# Patient Record
Sex: Male | Born: 1980 | Race: Asian | Hispanic: No | Marital: Married | Smoking: Never smoker
Health system: Southern US, Community
[De-identification: ages and names within clinical notes are randomized; demographics above are authoritative.]

## PROBLEM LIST (undated history)

## (undated) DIAGNOSIS — M109 Gout, unspecified: Secondary | ICD-10-CM

## (undated) DIAGNOSIS — Z9049 Acquired absence of other specified parts of digestive tract: Secondary | ICD-10-CM

## (undated) DIAGNOSIS — E669 Obesity, unspecified: Secondary | ICD-10-CM

## (undated) HISTORY — DX: Acquired absence of other specified parts of digestive tract: Z90.49

## (undated) HISTORY — PX: APPENDECTOMY: SHX54

## (undated) HISTORY — DX: Obesity, unspecified: E66.9

## (undated) HISTORY — DX: Gout, unspecified: M10.9

---

## 2012-01-02 ENCOUNTER — Ambulatory Visit (INDEPENDENT_AMBULATORY_CARE_PROVIDER_SITE_OTHER): Payer: BC Managed Care – PPO | Admitting: Internal Medicine

## 2012-01-02 ENCOUNTER — Encounter: Payer: Self-pay | Admitting: Internal Medicine

## 2012-01-02 VITALS — BP 116/90 | HR 74 | Temp 98.0°F | Resp 16 | Ht 67.5 in | Wt 233.8 lb

## 2012-01-02 DIAGNOSIS — Z0000 Encounter for general adult medical examination without abnormal findings: Secondary | ICD-10-CM

## 2012-01-02 DIAGNOSIS — L739 Follicular disorder, unspecified: Secondary | ICD-10-CM

## 2012-01-02 DIAGNOSIS — Z23 Encounter for immunization: Secondary | ICD-10-CM

## 2012-01-02 DIAGNOSIS — R361 Hematospermia: Secondary | ICD-10-CM

## 2012-01-02 DIAGNOSIS — R300 Dysuria: Secondary | ICD-10-CM

## 2012-01-02 DIAGNOSIS — L738 Other specified follicular disorders: Secondary | ICD-10-CM

## 2012-01-02 LAB — CBC WITH DIFFERENTIAL/PLATELET
Basophils Relative: 0 % (ref 0–1)
HCT: 45.6 % (ref 39.0–52.0)
Hemoglobin: 16 g/dL (ref 13.0–17.0)
Lymphocytes Relative: 30 % (ref 12–46)
Lymphs Abs: 2.3 10*3/uL (ref 0.7–4.0)
Monocytes Absolute: 0.7 10*3/uL (ref 0.1–1.0)
Monocytes Relative: 9 % (ref 3–12)
Neutro Abs: 4.6 10*3/uL (ref 1.7–7.7)
Neutrophils Relative %: 59 % (ref 43–77)
RBC: 5.32 MIL/uL (ref 4.22–5.81)

## 2012-01-02 LAB — HEPATIC FUNCTION PANEL
ALT: 35 U/L (ref 0–53)
Bilirubin, Direct: 0.1 mg/dL (ref 0.0–0.3)

## 2012-01-02 LAB — LIPID PANEL
Cholesterol: 176 mg/dL (ref 0–200)
LDL Cholesterol: 108 mg/dL — ABNORMAL HIGH (ref 0–99)
Total CHOL/HDL Ratio: 4 Ratio
VLDL: 24 mg/dL (ref 0–40)

## 2012-01-02 LAB — BASIC METABOLIC PANEL
CO2: 27 mEq/L (ref 19–32)
Chloride: 103 mEq/L (ref 96–112)
Glucose, Bld: 91 mg/dL (ref 70–99)
Potassium: 4.6 mEq/L (ref 3.5–5.3)
Sodium: 141 mEq/L (ref 135–145)

## 2012-01-02 LAB — TSH: TSH: 2.202 u[IU]/mL (ref 0.350–4.500)

## 2012-01-02 NOTE — Progress Notes (Signed)
  Subjective:    Patient ID: Connor Jefferson, male    DOB: March 07, 1981, 31 y.o.   MRN: [id number]  HPI  patient presents to clinic to establish care and physical exam. Has had eight occurrences the last occurrence approximately 3 weeks ago. No associated urethral discharge dysuria, penile or urethral pain. No scrotal masses noted by patient. Is sexually active. History of raised skin bumps lower posterior scalp. Has improved since stopped wearing hats. Was evaluated by dermatology in the past and took prescription topical gel without significant improvement.  Past Medical History  Diagnosis Date  . Hx of appendectomy    No past surgical history on file.  reports that he has never smoked. He does not have any smokeless tobacco history on file. His alcohol and drug histories not on file. family history includes Diabetes in his paternal grandfather; Healthy in his father; and Leukemia in his mother. No Known Allergies   Review of Systems  Genitourinary: Negative for dysuria, urgency, frequency, hematuria, discharge, penile swelling, scrotal swelling, difficulty urinating, penile pain and testicular pain.  Skin: Positive for rash.  All other systems reviewed and are negative.       Objective:   Physical Exam  Nursing note and vitals reviewed. Constitutional: He appears well-developed and well-nourished. No distress.  HENT:  Head: Normocephalic and atraumatic.  Right Ear: External ear normal.  Left Ear: External ear normal.  Nose: Nose normal.  Mouth/Throat: Oropharynx is clear and moist.  Eyes: Conjunctivae normal and EOM are normal. Pupils are equal, round, and reactive to light. No scleral icterus.  Neck: Neck supple. No thyromegaly present.  Cardiovascular: Normal rate, regular rhythm and normal heart sounds.  Exam reveals no gallop and no friction rub.   No murmur heard. Pulmonary/Chest: Effort normal and breath sounds normal. No respiratory distress. He has no wheezes. He has no  rales.  Abdominal: Soft. Bowel sounds are normal. He exhibits no distension and no mass. There is no tenderness. There is no rebound and no guarding.  Genitourinary: Penis normal. No penile tenderness.       No scrotal masses. No urethral discharge  Lymphadenopathy:    He has no cervical adenopathy.  Neurological: He is alert.  Skin: Skin is warm and dry. He is not diaphoretic.       Rare scattered erythematous papule located posterior scalp line. Appears follicular  Psychiatric: He has a normal mood and affect.          Assessment & Plan:

## 2012-01-03 LAB — URINALYSIS, ROUTINE W REFLEX MICROSCOPIC
Hgb urine dipstick: NEGATIVE
Leukocytes, UA: NEGATIVE
Nitrite: NEGATIVE
Protein, ur: NEGATIVE mg/dL
pH: 5.5 (ref 5.0–8.0)

## 2012-01-03 LAB — URINALYSIS, MICROSCOPIC ONLY
Bacteria, UA: NONE SEEN
Squamous Epithelial / LPF: NONE SEEN

## 2012-01-07 DIAGNOSIS — R361 Hematospermia: Secondary | ICD-10-CM | POA: Insufficient documentation

## 2012-01-07 DIAGNOSIS — Z0000 Encounter for general adult medical examination without abnormal findings: Secondary | ICD-10-CM | POA: Insufficient documentation

## 2012-01-07 DIAGNOSIS — L739 Follicular disorder, unspecified: Secondary | ICD-10-CM | POA: Insufficient documentation

## 2012-01-07 NOTE — Assessment & Plan Note (Signed)
Improved. Avoid hats. Attempt antibacterial soap cleansing at least 2-3 times a week

## 2012-01-07 NOTE — Assessment & Plan Note (Signed)
Normal exam. Obtain CPE labs. 

## 2012-01-07 NOTE — Assessment & Plan Note (Signed)
Obtain labs including urine analysis, urine for GC and chlamydia. If evaluation negative and has recurrence attempted empiric Septra. If further occurrences proceed with urology consultation /.states understanding and agreement

## 2012-07-04 ENCOUNTER — Emergency Department (HOSPITAL_BASED_OUTPATIENT_CLINIC_OR_DEPARTMENT_OTHER): Payer: BC Managed Care – PPO

## 2012-07-04 ENCOUNTER — Emergency Department (HOSPITAL_BASED_OUTPATIENT_CLINIC_OR_DEPARTMENT_OTHER)
Admission: EM | Admit: 2012-07-04 | Discharge: 2012-07-04 | Disposition: A | Discharge status: Home / Self Care | Payer: BC Managed Care – PPO | Attending: Emergency Medicine | Admitting: Emergency Medicine

## 2012-07-04 ENCOUNTER — Encounter (HOSPITAL_BASED_OUTPATIENT_CLINIC_OR_DEPARTMENT_OTHER): Payer: Self-pay | Admitting: *Deleted

## 2012-07-04 DIAGNOSIS — R0789 Other chest pain: Secondary | ICD-10-CM

## 2012-07-04 DIAGNOSIS — E669 Obesity, unspecified: Secondary | ICD-10-CM | POA: Insufficient documentation

## 2012-07-04 DIAGNOSIS — Z79899 Other long term (current) drug therapy: Secondary | ICD-10-CM | POA: Insufficient documentation

## 2012-07-04 NOTE — ED Notes (Signed)
Chest pain since this am. Sharp pain in his upper left chest.

## 2012-07-04 NOTE — ED Provider Notes (Signed)
History     CSN: [id number]  Arrival date & time 07/04/12  1954   First MD Initiated Contact with Patient 07/04/12 2043      Chief Complaint  Patient presents with  . Chest Pain    (Consider location/radiation/quality/duration/timing/severity/associated sxs/prior treatment) Patient is a 32 y.o. male presenting with chest pain.  Chest Pain  Patient has had 2 episodes of chest pain today, the first one was this morning, shortly after awakening lasted 21/2 hours left sided upper chest nonradiating pleuritic in quality and mild no associated shortness of breath no nausea or sweatiness symptoms resolved spontaneously without treatment. He experienced a second episode tonight at 7:30 PM which lasted approximately 30 minutes identical in quality to the first episode began results found his 2 without treatment. He is presently asymptomatic. No other associated symptoms no nausea no sweatiness. Cardiac risk factors none Past Medical History  Diagnosis Date  . Hx of appendectomy     Past Surgical History  Procedure Laterality Date  . Appendectomy      Family History  Problem Relation Age of Onset  . Diabetes Paternal Grandfather   . Leukemia Mother     deceased  . Healthy Father     History  Substance Use Topics  . Smoking status: Never Smoker   . Smokeless tobacco: Not on file  . Alcohol Use: No      Review of Systems  Constitutional: Negative.   HENT: Negative.   Respiratory: Negative.   Cardiovascular: Positive for chest pain.  Gastrointestinal: Negative.   Musculoskeletal: Negative.   Skin: Negative.   Psychiatric/Behavioral: Negative.   All other systems reviewed and are negative.    Allergies  Review of patient's allergies indicates no known allergies.  Home Medications   Current Outpatient Rx  Name  Route  Sig  Dispense  Refill  . Multiple Vitamin (MULTIVITAMIN) tablet   Oral   Take 1 tablet by mouth daily.           BP 127/87  Pulse 80   Temp(Src) 97.6 F (36.4 C) (Oral)  Resp 18  Wt 225 lb (102.059 kg)  BMI 34.7 kg/m2  SpO2 97%  Physical Exam  Nursing note and vitals reviewed. Constitutional: He appears well-developed and well-nourished.  HENT:  Head: Normocephalic and atraumatic.  Eyes: Conjunctivae are normal. Pupils are equal, round, and reactive to light.  Neck: Neck supple. No tracheal deviation present. No thyromegaly present.  Cardiovascular: Normal rate and regular rhythm.   No murmur heard. Pulmonary/Chest: Effort normal and breath sounds normal.  Abdominal: Soft. Bowel sounds are normal. He exhibits no distension. There is no tenderness.  Mildly obese  Musculoskeletal: Normal range of motion. He exhibits no edema and no tenderness.  Neurological: He is alert. Coordination normal.  Skin: Skin is warm and dry. No rash noted.  Psychiatric: He has a normal mood and affect.    ED Course  Procedures (including critical care time)  Labs Reviewed - No data to display No results found.   No diagnosis found.  Date: 07/04/2012  Rate: 80  Rhythm: normal sinus rhythm  QRS Axis: normal  Intervals: normal  ST/T Wave abnormalities: normal  Conduction Disutrbances: none  Narrative Interpretation: unremarkable No tracing to compare    Chest x-ray viewed by me   9:40 PM patient remains asymptomatic MDM   Plan keep scheduled appointment with PMD in 6 days. Assessment chest pain highly atypical for acute coronary syndrome in this young healthy male with atypical symptoms.  Strike her pulmonary embolism i.e. patient asymptomatic presently. Pain was mild and nonspecific Redness atypical chest pain        Doug Sou, MD 07/04/12 2148

## 2012-07-04 NOTE — ED Notes (Signed)
MD at bedside. 

## 2012-07-10 ENCOUNTER — Ambulatory Visit (INDEPENDENT_AMBULATORY_CARE_PROVIDER_SITE_OTHER): Payer: BC Managed Care – PPO | Admitting: Family

## 2012-07-10 ENCOUNTER — Encounter: Payer: Self-pay | Admitting: Family

## 2012-07-10 VITALS — BP 104/80 | HR 79 | Temp 98.6°F | Resp 16 | Wt 231.0 lb

## 2012-07-10 DIAGNOSIS — R0789 Other chest pain: Secondary | ICD-10-CM

## 2012-07-10 NOTE — Assessment & Plan Note (Signed)
Resolved. Was likely viral in origin.  Monitor.

## 2012-07-10 NOTE — Progress Notes (Signed)
  Subjective:    Patient ID: Connor Jefferson, male    DOB: Nov 14, 1980, 32 y.o.   MRN: [id number]  HPI  Connor Jefferson is a 32 yr old male who presents today for ED follow up. Records are reviewed.  He was seen on 3/27 with chief complaint of chest pain.  His chest pain was described as pleuritic in nature.  EKG was normal at that time.  CXR was negative.  He was discharged to home and recommended to keep scheduled appointment with [place] for atypical chest pain. He denies further chest pain. Denies sob, denies family hx of CAD.  He tells me today that he had cough and "phlegm" and felt like he was coming down with something the day that he went to the ED.   Review of Systems See HPI  Past Medical History  Diagnosis Date  . Hx of appendectomy     History   Social History  . Marital Status: Married    Spouse Name: N/A    Number of Children: N/A  . Years of Education: N/A   Occupational History  . Not on file.   Social History Main Topics  . Smoking status: Never Smoker   . Smokeless tobacco: Not on file  . Alcohol Use: No  . Drug Use: No  . Sexually Active: Not on file   Other Topics Concern  . Not on file   Social History Narrative  . No narrative on file    Past Surgical History  Procedure Laterality Date  . Appendectomy      Family History  Problem Relation Age of Onset  . Diabetes Paternal Grandfather   . Leukemia Mother     deceased  . Healthy Father     No Known Allergies  Current Outpatient Prescriptions on File Prior to Visit  Medication Sig Dispense Refill  . Multiple Vitamin (MULTIVITAMIN) tablet Take 1 tablet by mouth daily.       No current facility-administered medications on file prior to visit.    BP 104/80  Pulse 79  Temp(Src) 98.6 F (37 C) (Oral)  Resp 16  Wt 231 lb 0.6 oz (104.799 kg)  BMI 35.63 kg/m2  SpO2 98%       Objective:   Physical Exam  Constitutional: He is oriented to person, place, and time. He appears well-developed and  well-nourished. No distress.  HENT:  Head: Normocephalic and atraumatic.  Right Ear: Tympanic membrane and ear canal normal.  Left Ear: Tympanic membrane and ear canal normal.  Mouth/Throat: No oropharyngeal exudate, posterior oropharyngeal edema or posterior oropharyngeal erythema.  Cardiovascular: Normal rate and regular rhythm.   No murmur heard. Pulmonary/Chest: Effort normal and breath sounds normal. No respiratory distress. He has no wheezes. He has no rales. He exhibits no tenderness.  No chest wall tenderness to palpation.  Neurological: He is alert and oriented to person, place, and time.  Psychiatric: He has a normal mood and affect. His behavior is normal. Thought content normal.          Assessment & Plan:

## 2012-07-10 NOTE — Patient Instructions (Addendum)
Please schedule a physical in October 2014.

## 2013-01-06 ENCOUNTER — Encounter: Payer: BC Managed Care – PPO | Admitting: Internal Medicine

## 2013-01-13 ENCOUNTER — Encounter: Payer: BC Managed Care – PPO | Admitting: Family

## 2013-01-15 ENCOUNTER — Ambulatory Visit (INDEPENDENT_AMBULATORY_CARE_PROVIDER_SITE_OTHER): Payer: BC Managed Care – PPO | Admitting: Family Medicine

## 2013-01-15 ENCOUNTER — Encounter: Payer: Self-pay | Admitting: Family Medicine

## 2013-01-15 VITALS — BP 142/84 | HR 86 | Ht 68.0 in | Wt 236.0 lb

## 2013-01-15 DIAGNOSIS — Z0000 Encounter for general adult medical examination without abnormal findings: Secondary | ICD-10-CM

## 2013-01-15 DIAGNOSIS — Z131 Encounter for screening for diabetes mellitus: Secondary | ICD-10-CM

## 2013-01-15 DIAGNOSIS — Z13220 Encounter for screening for lipoid disorders: Secondary | ICD-10-CM

## 2013-01-15 DIAGNOSIS — Z23 Encounter for immunization: Secondary | ICD-10-CM

## 2013-01-15 NOTE — Patient Instructions (Signed)
Dr. Mikah Rottinghaus's General Advice Following Your Complete Physical Exam  The Benefits of Regular Exercise: Unless you suffer from an uncontrolled cardiovascular condition, studies strongly suggest that regular exercise and physical activity will add to both the quality and length of your life.  The World Health Organization recommends 150 minutes of moderate intensity aerobic activity every week.  This is best split over 3-4 days a week, and can be as simple as a brisk walk for just over 35 minutes "most days of the week".  This type of exercise has been shown to lower LDL-Cholesterol, lower average blood sugars, lower blood pressure, lower cardiovascular disease risk, improve memory, and increase one's overall sense of wellbeing.  The addition of anaerobic (or "strength training") exercises offers additional benefits including but not limited to increased metabolism, prevention of osteoporosis, and improved overall cholesterol levels.  How Can I Strive For A Low-Fat Diet?: Current guidelines recommend that 25-35 percent of your daily energy (food) intake should come from fats.  One might ask how can this be achieved without having to dissect each meal on a daily basis?  Switch to skim or 1% milk instead of whole milk.  Focus on lean meats such as ground turkey, fresh fish, baked chicken, and lean cuts of beef as your source of dietary protein.  Consume less than 300mg/day of dietary cholesterol.  Limit trans fatty acid consumption primarily by limiting synthetic trans fats such as partially hydrogenated oils (Ex: fried fast foods).  Focus efforts on reducing your intake of "solid" fats (Ex: Butter).  Substitute olive or vegetable oil for solid fats where possible.  Moderation of Salt Intake: Provided you don't carry a diagnosis of congestive heart failure nor renal failure, I recommend a daily allowance of no more than 2300 mg of salt (sodium).  Keeping under this daily goal is associated with a  decreased risk of cardiovascular events, creeping above it can lead to elevated blood pressures and increases your risk of cardiovascular events.  Milligrams (mg) of salt is listed on all nutrition labels, and your daily intake can add up faster than you think.  Most canned and frozen dinners can pack in over half your daily salt allowance in one meal.    Lifestyle Health Risks: Certain lifestyle choices carry specific health risks.  As you may already know, tobacco use has been associated with increasing one's risk of cardiovascular disease, pulmonary disease, numerous cancers, among many other issues.  What you may not know is that there are medications and nicotine replacement strategies that can more than double your chances of successfully quitting.  I would be thrilled to help manage your quitting strategy if you currently use tobacco products.  When it comes to alcohol use, I've yet to find an "ideal" daily allowance.  Provided an individual does not have a medical condition that is exacerbated by alcohol consumption, general guidelines determine "safe drinking" as no more than two standard drinks for a man or no more than one standard drink for a male per day.  However, much debate still exists on whether any amount of alcohol consumption is technically "safe".  My general advice, keep alcohol consumption to a minimum for general health promotion.  If you or others believe that alcohol, tobacco, or recreational drug use is interfering with your life, I would be happy to provide confidential counseling regarding treatment options.  General "Over The Counter" Nutrition Advice: Postmenopausal women should aim for a daily calcium intake of 1200 mg, however a significant   portion of this might already be provided by diets including milk, yogurt, cheese, and other dairy products.  Vitamin D has been shown to help preserve bone density, prevent fatigue, and has even been shown to help reduce falls in the  elderly.  Ensuring a daily intake of 800 Units of Vitamin D is a good place to start to enjoy the above benefits, we can easily check your Vitamin D level to see if you'd potentially benefit from supplementation beyond 800 Units a day.  Folic Acid intake should be of particular concern to women of childbearing age.  Daily consumption of 400-800 mcg of Folic Acid is recommended to minimize the chance of spinal cord defects in a fetus should pregnancy occur.    For many adults, accidents still remain one of the most common culprits when it comes to cause of death.  Some of the simplest but most effective preventitive habits you can adopt include regular seatbelt use, proper helmet use, securing firearms, and regularly testing your smoke and carbon monoxide detectors.  Ova Meegan B. Alberta Cairns DO [facility] [address], [address] [place], [place] [address] Phone: [phone]  

## 2013-01-15 NOTE — Progress Notes (Signed)
CC: Connor Jefferson is a 32 y.o. male is here for Establish Care   Subjective: HPI:  Colonoscopy: No family history: Cancer will begin screening at age 59 Prostate: Discussed screening risks/beneifts with patient on 01/15/2013 no family history of prostate cancer we'll consider screening age 83  Influenza Vaccine: He will receive today Pneumovax: No current indication Td/Tdap: Tday 2014 Zoster: (Start 31 yo)  Presents with no acute complaints requesting physical exam  Over the past 2 weeks have you been bothered by: - Little interest or pleasure in doing things: No - Feeling down depressed or hopeless: No  Rare alcohol use, no tobacco use, no recreational drug use  Tries to watch what he eats it gets for room for improvement, no formal exercise.  Review of Systems - General ROS: negative for - chills, fever, night sweats, weight gain or weight loss Ophthalmic ROS: negative for - decreased vision Psychological ROS: negative for - anxiety or depression ENT ROS: negative for - hearing change, nasal congestion, tinnitus or allergies Hematological and Lymphatic ROS: negative for - bleeding problems, bruising or swollen lymph nodes Breast ROS: negative Respiratory ROS: no cough, shortness of breath, or wheezing Cardiovascular ROS: no chest pain or dyspnea on exertion Gastrointestinal ROS: no abdominal pain, change in bowel habits, or black or bloody stools Genito-Urinary ROS: negative for - genital discharge, genital ulcers, incontinence or abnormal bleeding from genitals Musculoskeletal ROS: negative for - joint pain or muscle pain Neurological ROS: negative for - headaches or memory loss Dermatological ROS: negative for lumps, mole changes, rash and skin lesion changes  Past Medical History  Diagnosis Date  . Hx of appendectomy      Family History  Problem Relation Age of Onset  . Diabetes Paternal Grandfather   . Leukemia Mother     deceased  . Healthy Father       History  Substance Use Topics  . Smoking status: Never Smoker   . Smokeless tobacco: Not on file  . Alcohol Use: No     Objective: Filed Vitals:   01/15/13 1508  BP: 142/84  Pulse: 86    General: No Acute Distress HEENT: Atraumatic, normocephalic, conjunctivae normal without scleral icterus.  No nasal discharge, hearing grossly intact, TMs with good landmarks bilaterally with no middle ear abnormalities, posterior pharynx clear without oral lesions. Neck: Supple, trachea midline, no cervical nor supraclavicular adenopathy. Pulmonary: Clear to auscultation bilaterally without wheezing, rhonchi, nor rales. Cardiac: Regular rate and rhythm.  No murmurs, rubs, nor gallops. No peripheral edema.  2+ peripheral pulses bilaterally. Abdomen: Bowel sounds normal.  No masses.  Non-tender without rebound.  Negative Murphy's sign. GU: Bilateral descended testes no inguinal hernia MSK: Grossly intact, no signs of weakness.  Full strength throughout upper and lower extremities.  Full ROM in upper and lower extremities.  No midline spinal tenderness. Neuro: Gait unremarkable, CN II-XII grossly intact.  C5-C6 Reflex 2/4 Bilaterally, L4 Reflex 2/4 Bilaterally.  Cerebellar function intact. Skin: Scattered healing not infected mosquito bites on the legs Psych: Alert and oriented to person/place/time.  Thought process normal. No anxiety/depression.  Assessment & Plan: Connor Jefferson was seen today for establish care.  Diagnoses and associated orders for this visit:  Elevated blood pressure - BASIC METABOLIC PANEL WITH GFR  Screening, lipid - Lipid panel  Diabetes mellitus screening - BASIC METABOLIC PANEL WITH GFR  Physical exam, annual    Healthy lifestyle interventions including but limited to regular exercise, a healthy low fat diet, moderation of salt intake,  the dangers of tobacco/alcohol/recreational drug use, nutrition supplementation, and accident avoidance were discussed with the patient  and a handout was provided for future reference. Discussed low salt diet to lower blood pressure if blood pressure remains above 140/90 return in 4 weeks for further evaluation Routine lipid screening and diabetic screening   Return if symptoms worsen or fail to improve.

## 2013-01-30 ENCOUNTER — Ambulatory Visit (INDEPENDENT_AMBULATORY_CARE_PROVIDER_SITE_OTHER): Payer: BC Managed Care – PPO

## 2013-01-30 ENCOUNTER — Encounter: Payer: Self-pay | Admitting: Sports Medicine

## 2013-01-30 ENCOUNTER — Ambulatory Visit (INDEPENDENT_AMBULATORY_CARE_PROVIDER_SITE_OTHER): Payer: BC Managed Care – PPO | Admitting: Sports Medicine

## 2013-01-30 VITALS — BP 124/76 | HR 65 | Wt 234.0 lb

## 2013-01-30 DIAGNOSIS — M79609 Pain in unspecified limb: Secondary | ICD-10-CM

## 2013-01-30 DIAGNOSIS — M25579 Pain in unspecified ankle and joints of unspecified foot: Secondary | ICD-10-CM

## 2013-01-30 DIAGNOSIS — M79675 Pain in left toe(s): Secondary | ICD-10-CM | POA: Insufficient documentation

## 2013-01-30 MED ORDER — TRAMADOL HCL 50 MG PO TABS: ORAL_TABLET | ORAL | Status: DC

## 2013-01-30 NOTE — Progress Notes (Signed)
  Subjective:    CC: Left foot pain  HPI: This is a pleasant 32 year old male who presents with one week of foot pain. He hit his left foot against a metal pole while moving furniture about a week ago. The pain was not severe initially but it has worsened and become excruciating at times. The pain is localized at the medial surface of the left MTP. Weight-bearing is difficult. His job requires him to be on his feet, and he has only been able to tolerate working one day this week. He has had some bruising as well as some swelling that worsens with activity. He has been treating the pain and swelling with rest, ice, elevation and ibuprofen. The ibuprofen does not help much with the pain. He would like something stronger for the pain but needs a doctor's note for drug testing purposes.  Past medical history, Surgical history, Family history not pertinant except as noted below, Social history, Allergies, and medications have been entered into the medical record, reviewed, and no changes needed.   Review of Systems: No fevers, chills, night sweats, weight loss, chest pain, or shortness of breath.   Objective:    General: Well Developed, well nourished, and in no acute distress.  Neuro: Alert and oriented x3, extra-ocular muscles intact, sensation grossly intact.  HEENT: Normocephalic, atraumatic, pupils equal round reactive to light, neck supple, no masses, no lymphadenopathy, thyroid nonpalpable.  Skin: Warm and dry, no rashes. Cardiac: Regular rate and rhythm, no murmurs rubs or gallops, no lower extremity edema.  Respiratory: Clear to auscultation bilaterally. Not using accessory muscles, speaking in full sentences. Right Foot: Swelling and erythema at base of first MTP. Tender to palpation. Great toe flexion 4/5; Great toe extension 4/5.  Procedure: Real-time Ultrasound Guided aspiration/Injection of left first metatarsophalangeal joint Device: GE Logiq E  Verbal informed consent obtained.    Time-out conducted.  Noted no overlying erythema, induration, or other signs of local infection.  Skin prepped in a sterile fashion.  Local anesthesia: Topical Ethyl chloride.  With sterile technique and under real time ultrasound guidance:  A small amount of cloudy fluid was aspirated, this was diluted with sterile saline, and then deposited in the Vacutainer. Through a separate injection, 0.5 cc Kenalog 40, 0.5 cc lidocaine injected easily. Fluid will be sent off for crystal analysis. Completed without difficulty  Pain immediately resolved suggesting accurate placement of the medication.  Advised to call if fevers/chills, erythema, induration, drainage, or persistent bleeding.  Images permanently stored and available for review in the ultrasound unit.  Impression: Technically successful ultrasound guided injection.  Impression and Recommendations:    Plan: This is a 32 year old male with pain and swelling following trauma to the right 1st MTP that is suspicious for gout, although a fracture should be ruled out.  1. X-ray today of left foot 2. Aspiration of fluid to r/o gout 3. Glucocorticoid injection for pain relief 4. Tramadol for pain 5. Return for follow-up in 2 weeks  This note was originally written by Karin Lieu MS3.

## 2013-01-30 NOTE — Assessment & Plan Note (Signed)
X-rays were negative for fracture. Aspiration and injection. Will be sent off for crystal analysis. Tramadol for pain. Return to see me in 2 weeks to see how things are going.

## 2013-01-31 LAB — BASIC METABOLIC PANEL WITH GFR
BUN: 14 mg/dL (ref 6–23)
CO2: 31 mEq/L (ref 19–32)
Chloride: 102 mEq/L (ref 96–112)
GFR, Est Non African American: >89 mL/min
Potassium: 4.6 mEq/L (ref 3.5–5.3)

## 2013-01-31 LAB — SYNOVIAL CELL COUNT + DIFF, W/ CRYSTALS
Crystals, Fluid: NONE SEEN
Eosinophils-Synovial: 0 % (ref 0–1)
Lymphocytes-Synovial Fld: 7 % (ref 0–20)
Monocyte/Macrophage: 9 % — ABNORMAL LOW (ref 50–90)
Neutrophil, Synovial: 84 % — ABNORMAL HIGH (ref 0–25)
WBC, Synovial: 165 cu mm (ref 0–200)

## 2013-01-31 LAB — LIPID PANEL
Cholesterol: 184 mg/dL (ref 0–200)
VLDL: 38 mg/dL (ref 0–40)

## 2013-02-17 ENCOUNTER — Ambulatory Visit (INDEPENDENT_AMBULATORY_CARE_PROVIDER_SITE_OTHER): Payer: BC Managed Care – PPO | Admitting: Sports Medicine

## 2013-02-17 ENCOUNTER — Encounter: Payer: Self-pay | Admitting: Sports Medicine

## 2013-02-17 VITALS — BP 136/92 | HR 74 | Wt 229.0 lb

## 2013-02-17 DIAGNOSIS — M79609 Pain in unspecified limb: Secondary | ICD-10-CM

## 2013-02-17 DIAGNOSIS — M79675 Pain in left toe(s): Secondary | ICD-10-CM

## 2013-02-17 NOTE — Progress Notes (Signed)
  Subjective:    CC: Follow up  HPI: Left great toe pain: Completely resolved after injection. Very happy with results.  Past medical history, Surgical history, Family history not pertinant except as noted below, Social history, Allergies, and medications have been entered into the medical record, reviewed, and no changes needed.   Review of Systems: No fevers, chills, night sweats, weight loss, chest pain, or shortness of breath.   Objective:    General: Well Developed, well nourished, and in no acute distress.  Neuro: Alert and oriented x3, extra-ocular muscles intact, sensation grossly intact.  HEENT: Normocephalic, atraumatic, pupils equal round reactive to light, neck supple, no masses, no lymphadenopathy, thyroid nonpalpable.  Skin: Warm and dry, no rashes. Cardiac: Regular rate and rhythm, no murmurs rubs or gallops, no lower extremity edema.  Respiratory: Clear to auscultation bilaterally. Not using accessory muscles, speaking in full sentences. Left Foot: No visible erythema or swelling. Range of motion is full in all directions. Strength is 5/5 in all directions. No hallux valgus. No pes cavus or pes planus. No abnormal callus noted. No pain over the navicular prominence, or base of fifth metatarsal. No tenderness to palpation of the calcaneal insertion of plantar fascia. No pain at the Achilles insertion. No pain over the calcaneal bursa. No pain of the retrocalcaneal bursa. No tenderness to palpation over the tarsals, metatarsals, or phalanges. No hallux rigidus or limitus. No tenderness palpation over interphalangeal joints. No pain with compression of the metatarsal heads. Neurovascularly intact distally.  Impression and Recommendations:

## 2013-02-17 NOTE — Assessment & Plan Note (Addendum)
Completely resolved with guided injection.  He will come back for custom orthotics as he desires, we do need to check for leg length discrepancy. He will also be dropping off a biometric screening form for his PCP.

## 2013-03-17 ENCOUNTER — Encounter: Payer: Self-pay | Admitting: Sports Medicine

## 2013-03-17 ENCOUNTER — Ambulatory Visit (INDEPENDENT_AMBULATORY_CARE_PROVIDER_SITE_OTHER): Payer: BC Managed Care – PPO | Admitting: Sports Medicine

## 2013-03-17 VITALS — BP 143/84 | HR 85 | Wt 229.0 lb

## 2013-03-17 DIAGNOSIS — M79609 Pain in unspecified limb: Secondary | ICD-10-CM

## 2013-03-17 DIAGNOSIS — M79675 Pain in left toe(s): Secondary | ICD-10-CM

## 2013-03-17 NOTE — Assessment & Plan Note (Signed)
Orthotics as above. Return as needed. 

## 2013-03-17 NOTE — Progress Notes (Signed)
    Patient was fitted for a : standard, cushioned, semi-rigid orthotic. The orthotic was heated and afterward the patient stood on the orthotic blank positioned on the orthotic stand. The patient was positioned in subtalar neutral position and 10 degrees of ankle dorsiflexion in a weight bearing stance. After completion of molding, a stable base was applied to the orthotic blank. The blank was ground to a stable position for weight bearing. Size: 12 Base: Blue EVA Additional Posting and Padding: None The patient ambulated these, and they were very comfortable.  I spent 40 minutes with this patient, greater than 50% was face-to-face time counseling regarding the below diagnosis.   

## 2013-03-24 ENCOUNTER — Ambulatory Visit (INDEPENDENT_AMBULATORY_CARE_PROVIDER_SITE_OTHER): Payer: BC Managed Care – PPO | Admitting: Sports Medicine

## 2013-03-24 ENCOUNTER — Encounter: Payer: Self-pay | Admitting: Sports Medicine

## 2013-03-24 VITALS — BP 135/90 | HR 87 | Wt 232.0 lb

## 2013-03-24 DIAGNOSIS — M79672 Pain in left foot: Secondary | ICD-10-CM

## 2013-03-24 DIAGNOSIS — M25579 Pain in unspecified ankle and joints of unspecified foot: Secondary | ICD-10-CM

## 2013-03-24 DIAGNOSIS — M109 Gout, unspecified: Secondary | ICD-10-CM | POA: Insufficient documentation

## 2013-03-24 NOTE — Progress Notes (Signed)
  Subjective:    CC: Foot pain  HPI: This is a very pleasant 32 year old male, several months ago we aspirated and injected his metatarsophalngeal joint, the aspiration came back negative for any crystals. He did extremely well and we made him some custom orthotics. Unfortunately for the past few weeks she's had pain that he localizes in the dorsal and plantar mid foot, worse with weightbearing. He denies any injury, and has no constitutional symptoms. Pain is severe, persistent. No other joint pain or swelling.  Past medical history, Surgical history, Family history not pertinant except as noted below, Social history, Allergies, and medications have been entered into the medical record, reviewed, and no changes needed.   Review of Systems: No fevers, chills, night sweats, weight loss, chest pain, or shortness of breath.   Objective:    General: Well Developed, well nourished, and in no acute distress.  Neuro: Alert and oriented x3, extra-ocular muscles intact, sensation grossly intact.  HEENT: Normocephalic, atraumatic, pupils equal round reactive to light, neck supple, no masses, no lymphadenopathy, thyroid nonpalpable.  Skin: Warm and dry, no rashes. Cardiac: Regular rate and rhythm, no murmurs rubs or gallops, no lower extremity edema.  Respiratory: Clear to auscultation bilaterally. Not using accessory muscles, speaking in full sentences. Left Foot: No visible erythema or swelling. Range of motion is full in all directions. Strength is 5/5 in all directions. No hallux valgus. No pes cavus or pes planus. No abnormal callus noted. No pain over the navicular prominence, or base of fifth metatarsal. No tenderness to palpation of the calcaneal insertion of plantar fascia. No pain at the Achilles insertion. No pain over the calcaneal bursa. No pain of the retrocalcaneal bursa. Tender to palpation over the dorsal mid foot, also tender to palpation from the plantar aspect. No hallux  rigidus or limitus. No tenderness palpation over interphalangeal joints. No pain with compression of the metatarsal heads. Neurovascularly intact distally.  Procedure: Real-time Ultrasound Guided Injection of dorsal intertarsal joint Device: GE Logiq E  Verbal informed consent obtained.  Time-out conducted.  Noted no overlying erythema, induration, or other signs of local infection.  Skin prepped in a sterile fashion.  Local anesthesia: Topical Ethyl chloride.  With sterile technique and under real time ultrasound guidance:  Noted copious synovitis as well as effusion in the midfoot intertarsal joints. 25-gauge needle advanced into the joint, 0.5 cc Kenalog 40, 0.5 cc lidocaine injected easily. Completed without difficulty  Pain immediately resolved suggesting accurate placement of the medication.  Advised to call if fevers/chills, erythema, induration, drainage, or persistent bleeding.  Images permanently stored and available for review in the ultrasound unit.  Impression: Technically successful ultrasound guided injection.  Foot was then strapped with compressive dressing.  Impression and Recommendations:

## 2013-03-24 NOTE — Assessment & Plan Note (Signed)
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 Injection as above. If this recurs we could certainly need to consider crystalline arthropathy, and I would likely get advanced imaging. Return in one month.

## 2013-04-14 ENCOUNTER — Telehealth: Payer: Self-pay | Admitting: *Deleted

## 2013-04-14 ENCOUNTER — Ambulatory Visit (INDEPENDENT_AMBULATORY_CARE_PROVIDER_SITE_OTHER): Payer: BC Managed Care – PPO | Admitting: Sports Medicine

## 2013-04-14 ENCOUNTER — Encounter: Payer: Self-pay | Admitting: Sports Medicine

## 2013-04-14 VITALS — BP 124/81 | HR 87 | Wt 235.0 lb

## 2013-04-14 DIAGNOSIS — M79609 Pain in unspecified limb: Secondary | ICD-10-CM

## 2013-04-14 DIAGNOSIS — M79672 Pain in left foot: Secondary | ICD-10-CM

## 2013-04-14 MED ORDER — PREDNISONE (PAK) 10 MG PO TABS: ORAL_TABLET | ORAL | Status: DC

## 2013-04-14 NOTE — Telephone Encounter (Signed)
No PA required for MRI Foot[facility]An[facility] [place]  Meyer CoryMisty Ahmad, LPN

## 2013-04-14 NOTE — Progress Notes (Addendum)
  Subjective:    CC: Followup  HPI: Connor Jefferson comes back with persistent pain in his left foot, he has had synovitis in the metatarsophalangeal joint which was injected and improved, then the subtalar joint which was injected and improved, copious synovitis was seen on ultrasound. More recently he has pain he localizes posterior and distal to the medial malleolus. He does have some swelling in his ankle. We did attempt an aspiration when we did his metatarsophalangeal joint, this was negative for crystals however the sample was very small. Pain is moderate, persistent. Worse with weightbearing.  Past medical history, Surgical history, Family history not pertinant except as noted below, Social history, Allergies, and medications have been entered into the medical record, reviewed, and no changes needed.   Review of Systems: No fevers, chills, night sweats, weight loss, chest pain, or shortness of breath.   Objective:    General: Well Developed, well nourished, and in no acute distress.  Neuro: Alert and oriented x3, extra-ocular muscles intact, sensation grossly intact.  HEENT: Normocephalic, atraumatic, pupils equal round reactive to light, neck supple, no masses, no lymphadenopathy, thyroid nonpalpable.  Skin: Warm and dry, no rashes. Cardiac: Regular rate and rhythm, no murmurs rubs or gallops, no lower extremity edema.  Respiratory: Clear to auscultation bilaterally. Not using accessory muscles, speaking in full sentences. Left Ankle: Swollen around the talocrural joint, no tenderness over the subtalar joint or the first metatarsophalangeal joint. Range of motion is full in all directions. Strength is 5/5 in all directions. Stable lateral and medial ligaments; squeeze test and kleiger test unremarkable; Talar dome nontender; No pain at base of 5th MT; No tenderness over cuboid; No tenderness over N spot or navicular prominence No tenderness on posterior aspects of lateral and medial  malleolus No sign of peroneal tendon subluxations or tenderness to palpation Negative tarsal tunnel tinel's Able to walk 4 steps.  I spent a total of 40 minutes with this patient for greater than 50% was face-to-face time discussing the above diagnosis.  Impression and Recommendations:

## 2013-04-14 NOTE — Assessment & Plan Note (Addendum)
Pain has now traveled to the talocrural joint and tibialis posterior. He is status post injections of the first metatarsophalangeal joint as well as interpersonal joints with copious synovitis seen on ultrasound. Now he is pain-free at the prior injection sites. Unfortunately with persistent symptoms I am going to mobilize him in a boot, add a prednisone taper, and we are going to obtain an MRI of his foot and ankle. I like to see him back to go results of the MRI. At some point I would also certainly like to obtain serum uric acid levels.

## 2013-04-14 NOTE — Addendum Note (Signed)
Addended by: Monica BectonHEKKEKANDAM, Vicky Schleich J on: 04/14/2013 01:01 PM   Modules accepted: Level of Service

## 2013-04-15 ENCOUNTER — Ambulatory Visit (HOSPITAL_BASED_OUTPATIENT_CLINIC_OR_DEPARTMENT_OTHER)
Admission: RE | Admit: 2013-04-15 | Discharge: 2013-04-15 | Disposition: A | Discharge status: Home / Self Care | Payer: BC Managed Care – PPO | Source: Ambulatory Visit | Attending: Sports Medicine | Admitting: Sports Medicine

## 2013-04-15 DIAGNOSIS — M659 Synovitis and tenosynovitis, unspecified: Secondary | ICD-10-CM | POA: Insufficient documentation

## 2013-04-15 DIAGNOSIS — R269 Unspecified abnormalities of gait and mobility: Secondary | ICD-10-CM | POA: Insufficient documentation

## 2013-04-15 DIAGNOSIS — M65979 Unspecified synovitis and tenosynovitis, unspecified ankle and foot: Secondary | ICD-10-CM | POA: Insufficient documentation

## 2013-04-15 DIAGNOSIS — M9420 Chondromalacia, unspecified site: Secondary | ICD-10-CM | POA: Insufficient documentation

## 2013-04-15 DIAGNOSIS — M25476 Effusion, unspecified foot: Principal | ICD-10-CM | POA: Insufficient documentation

## 2013-04-15 DIAGNOSIS — M25473 Effusion, unspecified ankle: Secondary | ICD-10-CM | POA: Insufficient documentation

## 2013-04-15 DIAGNOSIS — M79609 Pain in unspecified limb: Secondary | ICD-10-CM | POA: Insufficient documentation

## 2013-04-21 ENCOUNTER — Telehealth: Payer: Self-pay | Admitting: Sports Medicine

## 2013-04-21 NOTE — Telephone Encounter (Signed)
Spoke to patient advised patient to schedule a fu appt to go over results. Rhonda Cunningham,CMA

## 2013-04-21 NOTE — Telephone Encounter (Signed)
Pt called about results from MRI done a week ago

## 2013-04-22 ENCOUNTER — Encounter: Payer: Self-pay | Admitting: Sports Medicine

## 2013-04-22 ENCOUNTER — Ambulatory Visit (INDEPENDENT_AMBULATORY_CARE_PROVIDER_SITE_OTHER): Payer: BC Managed Care – PPO | Admitting: Sports Medicine

## 2013-04-22 VITALS — BP 146/82 | HR 100 | Wt 230.0 lb

## 2013-04-22 DIAGNOSIS — M79672 Pain in left foot: Secondary | ICD-10-CM

## 2013-04-22 DIAGNOSIS — M79609 Pain in unspecified limb: Secondary | ICD-10-CM

## 2013-04-22 LAB — CBC WITH DIFFERENTIAL/PLATELET
Basophils Absolute: 0 K/uL (ref 0.0–0.1)
Basophils Relative: 0 % (ref 0–1)
Eosinophils Absolute: 0 10*3/uL (ref 0.0–0.7)
Eosinophils Relative: 0 % (ref 0–5)
HCT: 49.5 % (ref 39.0–52.0)
Hemoglobin: 17.1 g/dL — ABNORMAL HIGH (ref 13.0–17.0)
Lymphocytes Relative: 10 % — ABNORMAL LOW (ref 12–46)
Lymphs Abs: 1.4 10*3/uL (ref 0.7–4.0)
MCH: 30.1 pg (ref 26.0–34.0)
MCHC: 34.5 g/dL (ref 30.0–36.0)
MCV: 87 fL (ref 78.0–100.0)
Monocytes Absolute: 0.6 K/uL (ref 0.1–1.0)
Monocytes Relative: 4 % (ref 3–12)
Neutro Abs: 12.5 K/uL — ABNORMAL HIGH (ref 1.7–7.7)
Neutrophils Relative %: 86 % — ABNORMAL HIGH (ref 43–77)
Platelets: 320 10*3/uL (ref 150–400)
RBC: 5.69 MIL/uL (ref 4.22–5.81)
RDW: 13.4 % (ref 11.5–15.5)
WBC: 14.5 K/uL — ABNORMAL HIGH (ref 4.0–10.5)

## 2013-04-22 LAB — CK: Total CK: 50 U/L (ref 7–232)

## 2013-04-22 LAB — RHEUMATOID FACTOR: Rheumatoid fact SerPl-aCnc: 11 [IU]/mL (ref <=14)

## 2013-04-22 LAB — SEDIMENTATION RATE: Sed Rate: 4 mm/h (ref 0–16)

## 2013-04-22 LAB — URIC ACID: Uric Acid, Serum: 6.8 mg/dL (ref 4.0–7.8)

## 2013-04-22 MED ORDER — MELOXICAM 15 MG PO TABS: ORAL_TABLET | ORAL | Status: DC

## 2013-04-22 NOTE — Assessment & Plan Note (Signed)
Pain has resolved with prednisone taper. MRI does show fairly significant effusion in the talocrural joint, as well as the talonavicular joint. 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 He can come back to see me on an as needed basis. I am also going to add daily Mobic.

## 2013-04-22 NOTE — Progress Notes (Signed)
  Subjective:    CC: Follow up  HPI: Ankle pain: Connor Jefferson has had metatarsophalangeal as well as intertarsal joint injections in the past, more recently he presented with ankle joint pain and swelling. We placed him on prednisone, and ordered an MRI. His pain and swelling has essentially completely resolved, and he denies any constitutional symptoms. MRI results will be dictated below.  Past medical history, Surgical history, Family history not pertinant except as noted below, Social history, Allergies, and medications have been entered into the medical record, reviewed, and no changes needed.   Review of Systems: No fevers, chills, night sweats, weight loss, chest pain, or shortness of breath.   Objective:    General: Well Developed, well nourished, and in no acute distress.  Neuro: Alert and oriented x3, extra-ocular muscles intact, sensation grossly intact.  HEENT: Normocephalic, atraumatic, pupils equal round reactive to light, neck supple, no masses, no lymphadenopathy, thyroid nonpalpable.  Skin: Warm and dry, no rashes. Cardiac: Regular rate and rhythm, no murmurs rubs or gallops, no lower extremity edema.  Respiratory: Clear to auscultation bilaterally. Not using accessory muscles, speaking in full sentences. Left ankle: No visible erythema or swelling. Range of motion is full in all directions. Strength is 5/5 in all directions. Stable lateral and medial ligaments; squeeze test and kleiger test unremarkable; Talar dome nontender; No pain at base of 5th MT; No tenderness over cuboid; No tenderness over N spot or navicular prominence No tenderness on posterior aspects of lateral and medial malleolus No sign of peroneal tendon subluxations or tenderness to palpation Negative tarsal tunnel tinel's Able to walk 4 steps.  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  Impression and Recommendations:

## 2013-04-23 LAB — ANTI-NUCLEAR AB-TITER (ANA TITER): ANA Titer 1: NEGATIVE

## 2013-04-23 LAB — ANA: Anti Nuclear Antibody(ANA): POSITIVE — AB

## 2013-04-23 LAB — CYCLIC CITRUL PEPTIDE ANTIBODY, IGG: Cyclic Citrullin Peptide Ab: <2 U/mL (ref 0.0–5.0)

## 2013-12-12 ENCOUNTER — Telehealth: Payer: Self-pay | Admitting: Family Medicine

## 2013-12-12 DIAGNOSIS — R0683 Snoring: Secondary | ICD-10-CM

## 2013-12-12 DIAGNOSIS — G478 Other sleep disorders: Secondary | ICD-10-CM

## 2013-12-12 DIAGNOSIS — R0681 Apnea, not elsewhere classified: Secondary | ICD-10-CM

## 2013-12-12 MED ORDER — TRIAMCINOLONE ACETONIDE 0.1 % EX CREA: TOPICAL_CREAM | CUTANEOUS | Status: DC

## 2013-12-12 NOTE — Telephone Encounter (Signed)
Rash on arms while here for son's [facility]

## 2013-12-12 NOTE — Addendum Note (Signed)
Addended by: Laren Boom on: 12/12/2013 03:23 PM   Modules accepted: Orders

## 2014-03-04 ENCOUNTER — Encounter (HOSPITAL_BASED_OUTPATIENT_CLINIC_OR_DEPARTMENT_OTHER): Payer: BC Managed Care – PPO

## 2014-03-08 ENCOUNTER — Encounter (HOSPITAL_BASED_OUTPATIENT_CLINIC_OR_DEPARTMENT_OTHER): Payer: BC Managed Care – PPO

## 2014-03-20 ENCOUNTER — Ambulatory Visit (HOSPITAL_BASED_OUTPATIENT_CLINIC_OR_DEPARTMENT_OTHER): Payer: BC Managed Care – PPO | Attending: Family Medicine | Admitting: Radiology

## 2014-03-20 VITALS — Ht 68.0 in | Wt 230.0 lb

## 2014-03-20 DIAGNOSIS — R0681 Apnea, not elsewhere classified: Secondary | ICD-10-CM

## 2014-03-20 DIAGNOSIS — R0683 Snoring: Secondary | ICD-10-CM | POA: Diagnosis not present

## 2014-03-20 DIAGNOSIS — G4710 Hypersomnia, unspecified: Secondary | ICD-10-CM | POA: Diagnosis not present

## 2014-03-20 DIAGNOSIS — G478 Other sleep disorders: Secondary | ICD-10-CM

## 2014-03-20 DIAGNOSIS — G4730 Sleep apnea, unspecified: Secondary | ICD-10-CM | POA: Insufficient documentation

## 2014-03-30 NOTE — Addendum Note (Signed)
Addended by: Teodoro KilJENKINS, Glyndon Tursi D III on: 03/30/2014 10:43 AM   Modules accepted: Level of Service

## 2014-03-31 ENCOUNTER — Encounter: Payer: Self-pay | Admitting: Sports Medicine

## 2014-03-31 ENCOUNTER — Ambulatory Visit (INDEPENDENT_AMBULATORY_CARE_PROVIDER_SITE_OTHER): Payer: BC Managed Care – PPO | Admitting: Sports Medicine

## 2014-03-31 VITALS — BP 149/95 | HR 112 | Ht 69.0 in | Wt 244.0 lb

## 2014-03-31 DIAGNOSIS — M25572 Pain in left ankle and joints of left foot: Secondary | ICD-10-CM | POA: Diagnosis not present

## 2014-03-31 DIAGNOSIS — M79672 Pain in left foot: Secondary | ICD-10-CM

## 2014-03-31 MED ORDER — HYDROCODONE-ACETAMINOPHEN 5-325 MG PO TABS: 1.0000 | ORAL_TABLET | Freq: Three times a day (TID) | ORAL | Status: DC | PRN

## 2014-03-31 NOTE — Progress Notes (Signed)
  Subjective:    CC: Left ankle pain  HPI: This is a pleasant 33 year old male, haven't seen him in quite some time. In the distant past I injected his intertarsal joints, over one year ago. He had a fantastic response with a one-year relief of pain. More recently he developed increasing pain and swelling over the lateral ankle and first MTP. Pain is severe, persistent. 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  Past medical history, Surgical history, Family history not pertinant except as noted below, Social history, Allergies, and medications have been entered into the medical record, reviewed, and no changes needed.   Review of Systems: No fevers, chills, night sweats, weight loss, chest pain, or shortness of breath.   Objective:    General: Well Developed, well nourished, and in no acute distress.  Neuro: Alert and oriented x3, extra-ocular muscles intact, sensation grossly intact.  HEENT: Normocephalic, atraumatic, pupils equal round reactive to light, neck supple, no masses, no lymphadenopathy, thyroid nonpalpable.  Skin: Warm and dry, no rashes. Cardiac: Regular rate and rhythm, no murmurs rubs or gallops, no lower extremity edema.  Respiratory: Clear to auscultation bilaterally. Not using accessory muscles, speaking in full sentences. Left ankle: Swollen over the talocrural joint, there is exquisite tenderness to palpation around the peroneal tendons distal to the lateral malleolus, there is also significant swelling and tenderness to palpation over the first metatarsophalangeal joint. There is no erythema or induration.  Procedure: Real-time Ultrasound Guided Injection of left talocrural joint Device: GE Logiq E  Verbal informed consent obtained.  Time-out conducted.  Noted no overlying erythema, induration, or other signs of local infection.  Skin prepped in a sterile fashion.  Local anesthesia: Topical Ethyl chloride.  With sterile technique and under  real time ultrasound guidance:  Noted extensive effusion in the talocrural joint while I was imaging his peroneal tendons, I guided and 18-gauge needle into this effusion, and we aspirated 3 mL of cloudy straw-colored fluid, syringe switched and 1 mL kenalog 40, 2 mL lidocaine injected easily. Completed without difficulty  Pain immediately resolved suggesting accurate placement of the medication.  Advised to call if fevers/chills, erythema, induration, drainage, or persistent bleeding.  Images permanently stored and available for review in the ultrasound unit.  Impression: Technically successful ultrasound guided injection.  Procedure: Real-time Ultrasound Guided Injection of left first metatarsophalangeal joint Device: GE Logiq E  Verbal informed consent obtained.  Time-out conducted.  Noted no overlying erythema, induration, or other signs of local infection.  Skin prepped in a sterile fashion.  Local anesthesia: Topical Ethyl chloride.  With sterile technique and under real time ultrasound guidance:  0.5 mL kenalog 40, 0.5 mL lidocaine injected easily into the first metatarsophalangeal joint, there was significant effusion. Completed without difficulty  Pain immediately resolved suggesting accurate placement of the medication.  Advised to call if fevers/chills, erythema, induration, drainage, or persistent bleeding.  Images permanently stored and available for review in the ultrasound unit.  Impression: Technically successful ultrasound guided injection.  Impression and Recommendations:

## 2014-03-31 NOTE — Assessment & Plan Note (Addendum)
Aspiration of the intertarsal joints, I got a good 3 mL of cloudy fluid. We then injected the intertarsal joints as well as the left first metatarsophalangeal joint. Crystal analysis. Hydrocodone for pain. This does appear to be gout. Further treatment will however depend on results of the Crystal analysis. His first arthrocentesis was negative, however this one yielded much better fluid.

## 2014-04-02 LAB — SYNOVIAL CELL COUNT + DIFF, W/ CRYSTALS
Crystals, Fluid: NONE SEEN
Eosinophils-Synovial: 0 % (ref 0–1)
Lymphocytes-Synovial Fld: 20 % (ref 0–20)
Monocyte/Macrophage: 4 % — ABNORMAL LOW (ref 50–90)
Neutrophil, Synovial: 76 % — ABNORMAL HIGH (ref 0–25)
WBC, Synovial: 77990 uL — ABNORMAL HIGH (ref 0–200)

## 2014-04-05 DIAGNOSIS — G4730 Sleep apnea, unspecified: Secondary | ICD-10-CM

## 2014-04-05 DIAGNOSIS — G478 Other sleep disorders: Secondary | ICD-10-CM

## 2014-04-05 DIAGNOSIS — R0683 Snoring: Secondary | ICD-10-CM

## 2014-04-05 LAB — BODY FLUID CULTURE
Gram Stain: NONE SEEN
Organism ID, Bacteria: NO GROWTH

## 2014-04-05 NOTE — Sleep Study (Signed)
   [facility] DATE OF BIRTH:  1980/12/10 MEDICAL RECORD NUMBER [id number]  LOCATION: [facility]  PHYSICIAN: Tonnette Zwiebel D  DATE OF STUDY: 03/20/2014  SLEEP STUDY TYPE: Nocturnal Polysomnogram               REFERRING PHYSICIAN: Laren BoomHommel, Sean, DO  INDICATION FOR STUDY: Hypersomnia with sleep apnea  EPWORTH SLEEPINESS SCORE:   10/24 HEIGHT: 5\' 8"  (172.7 cm)  WEIGHT: 230 lb (104.327 kg)    Body mass index is 34.98 kg/(m^2).  NECK SIZE: 17.5 in.  MEDICATIONS: Charted for review   SLEEP ARCHITECTURE: total sleep time 309.5 minutes with sleep efficiency 80.1%. Stage I was 10.3%, stage II 75%, stage III 1.3%, REM 13.4% of total sleep time. Sleep latency 19 minutes, REM latency 137.5 minutes, awake after sleep onset 58 minutes, arousal index 6.8, bedtime medication: None   RESPIRATORY DATA: Apnea hypopnea index (AHI) 3.5 per hour. 18 total events scored including one obstructive apnea and 17 hypopneas. Events were more common while supine. REM AHI 8.7 per hour. There were not enough events to permit split protocol CPAP titration.  OXYGEN DATA: Moderately loud snoring with oxygen desaturation to a nadir of 87% and mean saturation 94.3% on room air.  CARDIAC DATA: Normal sinus rhythm  MOVEMENT/PARASOMNIA: A few incidental limb jerks were noted with little effect on sleep. Bathroom 1.  IMPRESSION/ RECOMMENDATION:   1) Within normal limits. Occasional respiratory events with sleep disturbance, AHI 3.5 per hour. The normal range for adults is an AHI from 0-5 events per hour. Moderately loud snoring with oxygen desaturation to a nadir of 87% and mean saturation 94.3% on room air. 2) Sleep architecture was not unusual for sleep center environment. Since a major complaint was daytime sleepiness, consider exploring whether the patient's sleep pattern at home is different.   Waymon BudgeYOUNG,Viet Kemmerer D Diplomate, American Board of Sleep Medicine  ELECTRONICALLY SIGNED ON:   04/05/2014, 9:19 AM [facility] PH: [phone]   FX: [phone] ACCREDITED BY THE AMERICAN ACADEMY OF SLEEP MEDICINE

## 2014-04-06 ENCOUNTER — Telehealth: Payer: Self-pay | Admitting: Family Medicine

## 2014-04-06 NOTE — Telephone Encounter (Signed)
Sue Lushndrea, Will you please let patient know that his sleep test was normal with no signs of sleep apnea.

## 2014-04-06 NOTE — Telephone Encounter (Signed)
Pt.notified

## 2014-04-14 ENCOUNTER — Ambulatory Visit (INDEPENDENT_AMBULATORY_CARE_PROVIDER_SITE_OTHER): Payer: BLUE CROSS/BLUE SHIELD | Admitting: Sports Medicine

## 2014-04-14 ENCOUNTER — Encounter: Payer: Self-pay | Admitting: Sports Medicine

## 2014-04-14 DIAGNOSIS — M2550 Pain in unspecified joint: Secondary | ICD-10-CM | POA: Diagnosis not present

## 2014-04-14 NOTE — Progress Notes (Signed)
  Subjective:    CC: Follow-up  HPI: Connor Jefferson returns, I have treated him on several occasions in the past for foot synovitis and effusion, he has responded well to aspirations and injections. Aspirations have never yielded any crystals however white blood cell counts have been as high as 70,000 with negative cultures. Initial rheumatoid workup was negative with the exception of a positive ANA with a negative titer. Uric acid levels were 6.8. He recently had another episode of synovitis, I was able to aspirate 3 mL of cloudy fluid from his subtalar joints, we then injected his symptoms resolved quickly. Fluid analysis was negative for crystals, cultures have been negative.  Past medical history, Surgical history, Family history not pertinant except as noted below, Social history, Allergies, and medications have been entered into the medical record, reviewed, and no changes needed.   Review of Systems: No fevers, chills, night sweats, weight loss, chest pain, or shortness of breath.   Objective:    General: Well Developed, well nourished, and in no acute distress.  Neuro: Alert and oriented x3, extra-ocular muscles intact, sensation grossly intact.  HEENT: Normocephalic, atraumatic, pupils equal round reactive to light, neck supple, no masses, no lymphadenopathy, thyroid nonpalpable.  Skin: Warm and dry, no rashes. Cardiac: Regular rate and rhythm, no murmurs rubs or gallops, no lower extremity edema.  Respiratory: Clear to auscultation bilaterally. Not using accessory muscles, speaking in full sentences. Feet: No visible erythema or swelling. Range of motion is full in all directions. Strength is 5/5 in all directions. No hallux valgus. No pes cavus or pes planus. No abnormal callus noted. No pain over the navicular prominence, or base of fifth metatarsal. No tenderness to palpation of the calcaneal insertion of plantar fascia. No pain at the Achilles insertion. No pain over the calcaneal  bursa. No pain of the retrocalcaneal bursa. No tenderness to palpation over the tarsals, metatarsals, or phalanges. No hallux rigidus or limitus. No tenderness palpation over interphalangeal joints. No pain with compression of the metatarsal heads. Neurovascularly intact distally.  Impression and Recommendations:

## 2014-04-14 NOTE — Assessment & Plan Note (Addendum)
Connor Jefferson had another episode of synovitis with joint effusion in the intertarsal joints. I aspirated 3 mL of cloudy fluid under ultrasound guidance with a 70,000 white blood cell count but no crystals and a negative Gram stain and culture. We injected the midfoot intertarsal joints under guidance, and he improved significantly. He did have significant synovitis as well. Previous rheumatoid workup was negative, he did have positive ANA with a negative titer. Uric acid levels were 6.8 at the time. I'm going to repeat a rheumatoid workup, and refer him to rheumatology for further assistance. We will forward the new blood work to rheumatology. I think she may fall in the gray area between normal and autoimmune disease.  Labs do show uric acid levels over 10, hyperuricemia combined with synovitis and effusion likely mean he has gout despite a negative crystal analysis. I am going to start relatively high-dose allopurinol, and recheck in one month.

## 2014-04-15 LAB — ANGIOTENSIN CONVERTING ENZYME: Angiotensin-Converting Enzyme: 31 U/L (ref 8–52)

## 2014-04-15 LAB — CBC WITH DIFFERENTIAL/PLATELET
Basophils Absolute: 0 K/uL (ref 0.0–0.1)
Basophils Relative: 0 % (ref 0–1)
Eosinophils Absolute: 0.2 10*3/uL (ref 0.0–0.7)
Eosinophils Relative: 2 % (ref 0–5)
HCT: 47.8 % (ref 39.0–52.0)
Hemoglobin: 16.7 g/dL (ref 13.0–17.0)
Lymphocytes Relative: 30 % (ref 12–46)
Lymphs Abs: 2.5 K/uL (ref 0.7–4.0)
MCH: 30.3 pg (ref 26.0–34.0)
MCHC: 34.9 g/dL (ref 30.0–36.0)
MCV: 86.6 fL (ref 78.0–100.0)
MPV: 10.2 fL (ref 8.6–12.4)
Monocytes Absolute: 0.6 K/uL (ref 0.1–1.0)
Monocytes Relative: 7 % (ref 3–12)
Neutro Abs: 5 10*3/uL (ref 1.7–7.7)
Neutrophils Relative %: 61 % (ref 43–77)
Platelets: 267 10*3/uL (ref 150–400)
RBC: 5.52 MIL/uL (ref 4.22–5.81)
RDW: 13.3 % (ref 11.5–15.5)
WBC: 8.2 K/uL (ref 4.0–10.5)

## 2014-04-15 LAB — COMPREHENSIVE METABOLIC PANEL WITH GFR
ALT: 36 U/L (ref 0–53)
Alkaline Phosphatase: 74 U/L (ref 39–117)
CO2: 30 meq/L (ref 19–32)
Chloride: 100 meq/L (ref 96–112)
Creat: 0.98 mg/dL (ref 0.50–1.35)
Potassium: 4.4 meq/L (ref 3.5–5.3)

## 2014-04-15 LAB — SYSTEMIC LUPUS PANEL-COMPREHENSIVE
ENA SM Ab Ser-aCnc: <1
Jo-1 Antibody, IgG: <1
SM/RNP: <1
SSA (Ro) (ENA) Antibody, IgG: <1
SSB (La) (ENA) Antibody, IgG: <1
Scleroderma (Scl-70) (ENA) Antibody, IgG: <1
ds DNA Ab: <1 IU/mL

## 2014-04-15 LAB — HEPATITIS PANEL, ACUTE
HCV Ab: NEGATIVE
Hep A IgM: NONREACTIVE
Hep B C IgM: NONREACTIVE
Hepatitis B Surface Ag: NEGATIVE

## 2014-04-15 LAB — COMPREHENSIVE METABOLIC PANEL
AST: 21 U/L (ref 0–37)
Albumin: 4.9 g/dL (ref 3.5–5.2)
BUN: 14 mg/dL (ref 6–23)
Calcium: 10 mg/dL (ref 8.4–10.5)
Glucose, Bld: 91 mg/dL (ref 70–99)
Sodium: 140 mEq/L (ref 135–145)
Total Bilirubin: 0.6 mg/dL (ref 0.2–1.2)
Total Protein: 8.2 g/dL (ref 6.0–8.3)

## 2014-04-15 LAB — RHEUMATOID FACTOR: Rheumatoid fact SerPl-aCnc: <10 [IU]/mL (ref <=14)

## 2014-04-15 LAB — CK: Total CK: 86 U/L (ref 7–232)

## 2014-04-15 LAB — HLA-B27 ANTIGEN: DNA Result:: NOT DETECTED

## 2014-04-15 LAB — IGG, IGA, IGM
IgA: 213 mg/dL (ref 68–379)
IgG (Immunoglobin G), Serum: 1560 mg/dL (ref 650–1600)
IgM, Serum: 112 mg/dL (ref 41–251)

## 2014-04-15 LAB — URIC ACID: Uric Acid, Serum: 10.2 mg/dL — ABNORMAL HIGH (ref 4.0–7.8)

## 2014-04-15 LAB — SEDIMENTATION RATE: Sed Rate: 1 mm/hr (ref 0–16)

## 2014-04-15 MED ORDER — ALLOPURINOL 300 MG PO TABS: 300.0000 mg | ORAL_TABLET | Freq: Every day | ORAL | Status: DC

## 2014-04-15 NOTE — Addendum Note (Signed)
Addended by: Monica BectonHEKKEKANDAM, Diamonique Ruedas J on: 04/15/2014 10:39 AM   Modules accepted: Orders

## 2014-04-16 LAB — PROTEIN ELECTROPHORESIS, SERUM, WITH REFLEX
Albumin ELP: 58.2 % (ref 55.8–66.1)
Alpha-1-Globulin: 3.3 % (ref 2.9–4.9)
Alpha-2-Globulin: 8.6 % (ref 7.1–11.8)
Beta 2: 5.6 % (ref 3.2–6.5)
Beta Globulin: 5.7 % (ref 4.7–7.2)
Gamma Globulin: 18.6 % (ref 11.1–18.8)
Total Protein, Serum Electrophoresis: 8.2 g/dL (ref 6.0–8.3)

## 2014-04-16 LAB — COMPLEMENT, TOTAL: Compl, Total (CH50): 54 U/mL (ref 31–60)

## 2014-04-16 LAB — CYCLIC CITRUL PEPTIDE ANTIBODY, IGG: Cyclic Citrullin Peptide Ab: <2 U/mL (ref 0.0–5.0)

## 2014-04-24 ENCOUNTER — Telehealth: Payer: Self-pay | Admitting: *Deleted

## 2014-04-24 NOTE — Telephone Encounter (Signed)
Connor Jefferson says that he has developed a rash since starting the allopurinol but only on his arm. He denies SOB or vision changes and no dizziness. He said that it also makes him very sleepy. Is this normal? Please advise.

## 2014-04-24 NOTE — Telephone Encounter (Signed)
Decrease to one half tab at bedtime for a week then go back up to 1 full tablet at bedtime

## 2014-04-24 NOTE — Telephone Encounter (Signed)
Detailed voicemail left for patient and for him to call office if he has any further questions.

## 2014-05-01 ENCOUNTER — Ambulatory Visit: Payer: BLUE CROSS/BLUE SHIELD | Admitting: Sports Medicine

## 2014-05-12 ENCOUNTER — Ambulatory Visit: Payer: BLUE CROSS/BLUE SHIELD | Admitting: Sports Medicine

## 2014-08-21 ENCOUNTER — Ambulatory Visit (INDEPENDENT_AMBULATORY_CARE_PROVIDER_SITE_OTHER): Payer: BLUE CROSS/BLUE SHIELD | Admitting: Family Medicine

## 2014-08-21 ENCOUNTER — Encounter: Payer: Self-pay | Admitting: Family Medicine

## 2014-08-21 VITALS — BP 146/93 | HR 103 | Wt 236.0 lb

## 2014-08-21 DIAGNOSIS — R361 Hematospermia: Secondary | ICD-10-CM | POA: Diagnosis not present

## 2014-08-21 DIAGNOSIS — M7021 Olecranon bursitis, right elbow: Secondary | ICD-10-CM

## 2014-08-21 MED ORDER — SULFAMETHOXAZOLE-TRIMETHOPRIM 800-160 MG PO TABS: 1.0000 | ORAL_TABLET | Freq: Two times a day (BID) | ORAL | Status: DC

## 2014-08-21 NOTE — Progress Notes (Signed)
CC: Connor Jefferson is a 34 y.o. male is here for blood in semen   Subjective: HPI:  Bright red blood in ejaculate that occurred on Sunday. When this occurred it had immediate testicular and penile shaft pain. Since then he's had no other genitourinary complaints. He's had sex since then and it did not cause any pain or produce any blood in his semen. He denies dysuria, urinary urgency, frequency, nor discharge. He had this happen once before but was 3 years ago. He denies any recent urologic surgery. No family history of prostate cancer.  Complaints of swelling of right elbow. It is been present for the last month it seems to be getting bigger on a weekly basis. Painless. Nothing seems to make it better or worse there's been no intervention as of yet and he denies any elbow pain or recent injury. No fevers, chills, nausea, flushing, nor joint pain elsewhere.   Review Of Systems Outlined In HPI  Past Medical History  Diagnosis Date  . Hx of appendectomy     Past Surgical History  Procedure Laterality Date  . Appendectomy     Family History  Problem Relation Age of Onset  . Diabetes Paternal Grandfather   . Leukemia Mother     deceased  . Healthy Father     History   Social History  . Marital Status: Married    Spouse Name: N/A  . Number of Children: N/A  . Years of Education: N/A   Occupational History  . Not on file.   Social History Main Topics  . Smoking status: Never Smoker   . Smokeless tobacco: Not on file  . Alcohol Use: No  . Drug Use: No  . Sexual Activity: Yes   Other Topics Concern  . Not on file   Social History Narrative     Objective: BP 146/93 mmHg  Pulse 103  Wt 236 lb (107.049 kg)  Vital signs reviewed. General: Alert and Oriented, No Acute Distress HEENT: Pupils equal, round, reactive to light. Conjunctivae clear.  External ears unremarkable.  Moist mucous membranes. Lungs: Clear and comfortable work of breathing, speaking in full sentences  without accessory muscle use. Cardiac: Regular rate and rhythm.  Neuro: CN II-XII grossly intact, gait normal. Extremities: No peripheral edema.  Strong peripheral pulses. Mild swelling and redness overlying the olecranon on the right elbow Mental Status: No depression, anxiety, nor agitation. Logical though process. Skin: Warm and dry.  Assessment & Plan: Connor Jefferson was seen today for blood in semen.  Diagnoses and all orders for this visit:  Hematospermia Orders: -     Urine culture  Olecranon bursitis, right  Other orders -     sulfamethoxazole-trimethoprim (BACTRIM DS,SEPTRA DS) 800-160 MG per tablet; Take 1 tablet by mouth 2 (two) times daily.   Hematospermia: Checking urine culture suspect this is due to bacterial prostatitis. Start Bactrim for 2 weeks pending urine culture. Olecranon bursitis: Discussed wearing compression wrap daily basis for the next week and if no improvement follow up with Dr. Karie Schwalbe for consideration of drainage.  Return if symptoms worsen or fail to improve.

## 2014-08-23 LAB — URINE CULTURE
COLONY COUNT: NO GROWTH
Organism ID, Bacteria: NO GROWTH

## 2014-08-28 ENCOUNTER — Ambulatory Visit: Payer: BLUE CROSS/BLUE SHIELD | Admitting: Sports Medicine

## 2015-01-27 ENCOUNTER — Telehealth: Payer: Self-pay | Admitting: Family Medicine

## 2015-01-27 DIAGNOSIS — M2550 Pain in unspecified joint: Secondary | ICD-10-CM

## 2015-01-27 NOTE — Telephone Encounter (Signed)
Dr. Ivan AnchorsHommel,   Ms. Stillings called today to ask if we could place another rheumatology referral due to the current rheumatologist does not call in advance for appointment reminders and the patient always has to reschedule them and when he calls to reschedule no one at the office will answer the phone. She also stated the patient was supposed to be seen monthly and due to not getting reminder calls and not being able to get a hold of someone to schedule he is not able to see him monthly. - CF

## 2015-01-27 NOTE — Telephone Encounter (Signed)
Arline Aspindy, Will you please let patient know I've placed this referral to be at a location other than [facility].

## 2015-06-29 ENCOUNTER — Encounter: Payer: Self-pay | Admitting: Sports Medicine

## 2015-06-29 ENCOUNTER — Ambulatory Visit (INDEPENDENT_AMBULATORY_CARE_PROVIDER_SITE_OTHER): Payer: BLUE CROSS/BLUE SHIELD | Admitting: Sports Medicine

## 2015-06-29 DIAGNOSIS — M10072 Idiopathic gout, left ankle and foot: Secondary | ICD-10-CM | POA: Diagnosis not present

## 2015-06-29 NOTE — Progress Notes (Signed)
  Subjective:    CC: left ankle swelling  HPI: This is a pleasant 35 year old male with gout, currently on Uloric, past several days he's had increasing pain and swelling in his left ankle, moderate, persistent without radiation.  Past medical history, Surgical history, Family history not pertinant except as noted below, Social history, Allergies, and medications have been entered into the medical record, reviewed, and no changes needed.   Review of Systems: No fevers, chills, night sweats, weight loss, chest pain, or shortness of breath.   Objective:    General: Well Developed, well nourished, and in no acute distress.  Neuro: Alert and oriented x3, extra-ocular muscles intact, sensation grossly intact.  HEENT: Normocephalic, atraumatic, pupils equal round reactive to light, neck supple, no masses, no lymphadenopathy, thyroid nonpalpable.  Skin: Warm and dry, no rashes. Cardiac: Regular rate and rhythm, no murmurs rubs or gallops, no lower extremity edema.  Respiratory: Clear to auscultation bilaterally. Not using accessory muscles, speaking in full sentences. Left ankle: Visibly swollen, tender to palpation at the lateral talocrural joint. Neurovascularly intact distally, no cutaneous erythema or induration.  Procedure: Real-time Ultrasound Guided Injection of left ankle Device: GE Logiq E  Verbal informed consent obtained.  Time-out conducted.  Noted no overlying erythema, induration, or other signs of local infection.  Skin prepped in a sterile fashion.  Local anesthesia: Topical Ethyl chloride.  With sterile technique and under real time ultrasound guidance:  Noted a branch of the dorsalis pedis artery overlying the talocrural joint, for this reason I took a lateral short axis approach into the joint avoiding the arterial structure, and 1 mL kenalog 40, 1 mL lidocaine, 1 mL Marcaine injected easily. Completed without difficulty  Pain immediately resolved suggesting accurate  placement of the medication.  Advised to call if fevers/chills, erythema, induration, drainage, or persistent bleeding.  Images permanently stored and available for review in the ultrasound unit.  Impression: Technically successful ultrasound guided injection.  The ankle was then strapped with compressive dressing.  Impression and Recommendations:

## 2015-06-29 NOTE — Assessment & Plan Note (Addendum)
Currently on Uloric, had an allergic reaction to allopurinol. Injection of the left ankle as above. Checking uric acid levels. Goal is less than 5, and ultimately we may have to add colchicine daily.  Adding Zurampic, continue Uloric.

## 2015-06-30 LAB — BASIC METABOLIC PANEL WITH GFR
BUN: 14 mg/dL (ref 7–25)
Calcium: 9.4 mg/dL (ref 8.6–10.3)
Chloride: 101 mmol/L (ref 98–110)
Glucose, Bld: 96 mg/dL (ref 65–99)
Potassium: 4.2 mmol/L (ref 3.5–5.3)

## 2015-06-30 LAB — URIC ACID: Uric Acid, Serum: 5.3 mg/dL (ref 4.0–7.8)

## 2015-06-30 LAB — BASIC METABOLIC PANEL
CO2: 28 mmol/L (ref 20–31)
Creat: 0.89 mg/dL (ref 0.60–1.35)
Sodium: 140 mmol/L (ref 135–146)

## 2015-06-30 MED ORDER — LESINURAD 200 MG PO TABS: 200.0000 mg | ORAL_TABLET | Freq: Every day | ORAL | Status: DC

## 2015-06-30 NOTE — Addendum Note (Signed)
Addended by: Monica BectonHEKKEKANDAM, Myonna Chisom J on: 06/30/2015 08:54 AM   Modules accepted: Orders

## 2015-07-27 ENCOUNTER — Ambulatory Visit (INDEPENDENT_AMBULATORY_CARE_PROVIDER_SITE_OTHER): Payer: BLUE CROSS/BLUE SHIELD | Admitting: Sports Medicine

## 2015-07-27 ENCOUNTER — Encounter: Payer: Self-pay | Admitting: Sports Medicine

## 2015-07-27 DIAGNOSIS — Z0000 Encounter for general adult medical examination without abnormal findings: Secondary | ICD-10-CM | POA: Diagnosis not present

## 2015-07-27 DIAGNOSIS — M10072 Idiopathic gout, left ankle and foot: Secondary | ICD-10-CM | POA: Diagnosis not present

## 2015-07-27 NOTE — Assessment & Plan Note (Signed)
Since we are checking uric acid levels I'm going to add his routine lipid panel and other blood work to follow up with his PCP.

## 2015-07-27 NOTE — Assessment & Plan Note (Signed)
Doing fantastic on Uloric and Zurampic. Rechecking uric acid levels in one month, he has only been on the new medication for a few days

## 2015-07-27 NOTE — Progress Notes (Signed)
  Subjective:    CC: Gout  HPI: This is a pleasant 35 year old male, he is now on both Uloric and Zurampic, doing well, pain is completely gone.  Past medical history, Surgical history, Family history not pertinant except as noted below, Social history, Allergies, and medications have been entered into the medical record, reviewed, and no changes needed.   Review of Systems: No fevers, chills, night sweats, weight loss, chest pain, or shortness of breath.   Objective:    General: Well Developed, well nourished, and in no acute distress.  Neuro: Alert and oriented x3, extra-ocular muscles intact, sensation grossly intact.  HEENT: Normocephalic, atraumatic, pupils equal round reactive to light, neck supple, no masses, no lymphadenopathy, thyroid nonpalpable.  Skin: Warm and dry, no rashes. Cardiac: Regular rate and rhythm, no murmurs rubs or gallops, no lower extremity edema.  Respiratory: Clear to auscultation bilaterally. Not using accessory muscles, speaking in full sentences.  Impression and Recommendations:

## 2015-09-04 DIAGNOSIS — M109 Gout, unspecified: Secondary | ICD-10-CM | POA: Diagnosis not present

## 2015-09-04 DIAGNOSIS — S2231XA Fracture of one rib, right side, initial encounter for closed fracture: Secondary | ICD-10-CM | POA: Diagnosis not present

## 2015-09-04 DIAGNOSIS — S0990XA Unspecified injury of head, initial encounter: Secondary | ICD-10-CM | POA: Diagnosis not present

## 2015-09-04 DIAGNOSIS — Z79899 Other long term (current) drug therapy: Secondary | ICD-10-CM | POA: Diagnosis not present

## 2015-09-04 DIAGNOSIS — T148 Other injury of unspecified body region: Secondary | ICD-10-CM | POA: Diagnosis not present

## 2015-09-04 DIAGNOSIS — M549 Dorsalgia, unspecified: Secondary | ICD-10-CM | POA: Diagnosis not present

## 2015-09-04 DIAGNOSIS — S2232XA Fracture of one rib, left side, initial encounter for closed fracture: Secondary | ICD-10-CM | POA: Diagnosis not present

## 2015-09-04 DIAGNOSIS — Z888 Allergy status to other drugs, medicaments and biological substances status: Secondary | ICD-10-CM | POA: Diagnosis not present

## 2015-09-04 DIAGNOSIS — M542 Cervicalgia: Secondary | ICD-10-CM | POA: Diagnosis not present

## 2015-09-04 DIAGNOSIS — S60811A Abrasion of right wrist, initial encounter: Secondary | ICD-10-CM | POA: Diagnosis not present

## 2015-09-04 DIAGNOSIS — S3991XA Unspecified injury of abdomen, initial encounter: Secondary | ICD-10-CM | POA: Diagnosis not present

## 2015-09-04 DIAGNOSIS — R0789 Other chest pain: Secondary | ICD-10-CM | POA: Diagnosis not present

## 2015-09-05 DIAGNOSIS — S3991XA Unspecified injury of abdomen, initial encounter: Secondary | ICD-10-CM | POA: Diagnosis not present

## 2015-09-05 DIAGNOSIS — M542 Cervicalgia: Secondary | ICD-10-CM | POA: Diagnosis not present

## 2015-09-05 DIAGNOSIS — S2231XA Fracture of one rib, right side, initial encounter for closed fracture: Secondary | ICD-10-CM | POA: Diagnosis not present

## 2015-09-05 DIAGNOSIS — S0990XA Unspecified injury of head, initial encounter: Secondary | ICD-10-CM | POA: Diagnosis not present

## 2015-09-14 ENCOUNTER — Ambulatory Visit (INDEPENDENT_AMBULATORY_CARE_PROVIDER_SITE_OTHER): Payer: BLUE CROSS/BLUE SHIELD

## 2015-09-14 ENCOUNTER — Ambulatory Visit (INDEPENDENT_AMBULATORY_CARE_PROVIDER_SITE_OTHER): Payer: BLUE CROSS/BLUE SHIELD | Admitting: Family Medicine

## 2015-09-14 ENCOUNTER — Encounter: Payer: Self-pay | Admitting: Family Medicine

## 2015-09-14 VITALS — BP 142/91 | HR 93 | Wt 234.0 lb

## 2015-09-14 DIAGNOSIS — T148XXA Other injury of unspecified body region, initial encounter: Secondary | ICD-10-CM

## 2015-09-14 DIAGNOSIS — S2242XA Multiple fractures of ribs, left side, initial encounter for closed fracture: Secondary | ICD-10-CM

## 2015-09-14 DIAGNOSIS — T148 Other injury of unspecified body region: Secondary | ICD-10-CM

## 2015-09-14 DIAGNOSIS — S2232XA Fracture of one rib, left side, initial encounter for closed fracture: Secondary | ICD-10-CM

## 2015-09-14 MED ORDER — SILVER SULFADIAZINE 1 % EX CREA: 1.0000 "application " | TOPICAL_CREAM | Freq: Two times a day (BID) | CUTANEOUS | Status: DC
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
## 2015-10-18 DIAGNOSIS — M1A09X1 Idiopathic chronic gout, multiple sites, with tophus (tophi): Secondary | ICD-10-CM | POA: Diagnosis not present

## 2015-12-20 ENCOUNTER — Ambulatory Visit (INDEPENDENT_AMBULATORY_CARE_PROVIDER_SITE_OTHER): Payer: BLUE CROSS/BLUE SHIELD | Admitting: Family Medicine

## 2015-12-20 ENCOUNTER — Ambulatory Visit (INDEPENDENT_AMBULATORY_CARE_PROVIDER_SITE_OTHER): Payer: BLUE CROSS/BLUE SHIELD

## 2015-12-20 VITALS — BP 138/91 | HR 73 | Wt 237.0 lb

## 2015-12-20 DIAGNOSIS — Z114 Encounter for screening for human immunodeficiency virus [HIV]: Secondary | ICD-10-CM

## 2015-12-20 DIAGNOSIS — Z0000 Encounter for general adult medical examination without abnormal findings: Secondary | ICD-10-CM | POA: Diagnosis not present

## 2015-12-20 DIAGNOSIS — M25561 Pain in right knee: Secondary | ICD-10-CM

## 2015-12-20 DIAGNOSIS — M10072 Idiopathic gout, left ankle and foot: Secondary | ICD-10-CM | POA: Diagnosis not present

## 2015-12-20 DIAGNOSIS — Z23 Encounter for immunization: Secondary | ICD-10-CM | POA: Diagnosis not present

## 2015-12-20 LAB — CBC
HEMATOCRIT: 46 % (ref 38.5–50.0)
HEMOGLOBIN: 15.8 g/dL (ref 13.2–17.1)
MCH: 29.9 pg (ref 27.0–33.0)
MCHC: 34.3 g/dL (ref 32.0–36.0)
MCV: 87.1 fL (ref 80.0–100.0)
MPV: 10.7 fL (ref 7.5–12.5)
Platelets: 220 10*3/uL (ref 140–400)
RBC: 5.28 MIL/uL (ref 4.20–5.80)
RDW: 13.6 % (ref 11.0–15.0)
WBC: 6.9 10*3/uL (ref 3.8–10.8)

## 2015-12-20 LAB — COMPREHENSIVE METABOLIC PANEL
ALBUMIN: 4.7 g/dL (ref 3.6–5.1)
ALK PHOS: 62 U/L (ref 40–115)
ALT: 39 U/L (ref 9–46)
AST: 27 U/L (ref 10–40)
BILIRUBIN TOTAL: 0.6 mg/dL (ref 0.2–1.2)
BUN: 15 mg/dL (ref 7–25)
CALCIUM: 9.6 mg/dL (ref 8.6–10.3)
CO2: 29 mmol/L (ref 20–31)
Chloride: 100 mmol/L (ref 98–110)
Creat: 0.92 mg/dL (ref 0.60–1.35)
GLUCOSE: 91 mg/dL (ref 65–99)
Potassium: 4.1 mmol/L (ref 3.5–5.3)
Sodium: 139 mmol/L (ref 135–146)
Total Protein: 7.5 g/dL (ref 6.1–8.1)

## 2015-12-20 LAB — LIPID PANEL
Cholesterol: 174 mg/dL (ref 125–200)
HDL: 44 mg/dL (ref >=40)
LDL CALC: 85 mg/dL (ref <130)
TRIGLYCERIDES: 224 mg/dL — AB (ref <150)
Total CHOL/HDL Ratio: 4 Ratio (ref <=5.0)
VLDL: 45 mg/dL — AB (ref <30)

## 2015-12-20 LAB — URIC ACID: Uric Acid, Serum: 5 mg/dL (ref 4.0–8.0)

## 2015-12-20 LAB — HIV ANTIBODY (ROUTINE TESTING W REFLEX): HIV 1&2 Ab, 4th Generation: NONREACTIVE

## 2015-12-20 LAB — HEMOGLOBIN A1C
HEMOGLOBIN A1C: 5.4 % (ref <5.7)
Mean Plasma Glucose: 108 mg/dL

## 2015-12-20 LAB — TSH: TSH: 1.37 mIU/L (ref 0.40–4.50)

## 2015-12-20 MED ORDER — COLCHICINE 0.6 MG PO TABS: 0.6000 mg | ORAL_TABLET | Freq: Every day | ORAL | 1 refills | Status: DC

## 2015-12-20 MED ORDER — COLCHICINE 0.6 MG PO CAPS: 1.0000 | ORAL_CAPSULE | Freq: Every day | ORAL | 1 refills | Status: DC
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
## 2015-12-20 NOTE — Patient Instructions (Signed)
Thank you for coming in today. Get fasting labs and xray today.  Start colchicine.  Return in 2-3 months for a recheck.  Continue uloric.   Low-Purine Diet Purines are compounds that affect the level of uric acid in your body. A low-purine diet is a diet that is low in purines. Eating a low-purine diet can prevent the level of uric acid in your body from getting too high and causing gout or kidney stones or both. WHAT DO I NEED TO KNOW ABOUT THIS DIET?  Choose low-purine foods. Examples of low-purine foods are listed in the next section.  Drink plenty of fluids, especially water. Fluids can help remove uric acid from your body. Try to drink 8-16 cups (1.9-3.8 L) a day.  Limit foods high in fat, especially saturated fat, as fat makes it harder for the body to get rid of uric acid. Foods high in saturated fat include pizza, cheese, ice cream, whole milk, fried foods, and gravies. Choose foods that are lower in fat and lean sources of protein. Use olive oil when cooking as it contains healthy fats that are not high in saturated fat.  Limit alcohol. Alcohol interferes with the elimination of uric acid from your body. If you are having a gout attack, avoid all alcohol.  Keep in mind that different people's bodies react differently to different foods. You will probably learn over time which foods do or do not affect you. If you discover that a food tends to cause your gout to flare up, avoid eating that food. You can more freely enjoy foods that do not cause problems. If you have any questions about a food item, talk to your dietitian or health care provider. WHICH FOODS ARE LOW, MODERATE, AND HIGH IN PURINES? The following is a list of foods that are low, moderate, and high in purines. You can eat any amount of the foods that are low in purines. You may be able to have small amounts of foods that are moderate in purines. Ask your health care provider how much of a food moderate in purines you can  have. Avoid foods high in purines. Grains  Foods low in purines: Enriched white bread, pasta, rice, cake, cornbread, popcorn.  Foods moderate in purines: Whole-grain breads and cereals, wheat germ, bran, oatmeal. Uncooked oatmeal. Dry wheat bran or wheat germ.  Foods high in purines: Pancakes, [place] toast, biscuits, muffins. Vegetables  Foods low in purines: All vegetables, except those that are moderate in purines.  Foods moderate in purines: Asparagus, cauliflower, spinach, mushrooms, green peas. Fruits  All fruits are low in purines. Meats and other Protein Foods  Foods low in purines: Eggs, nuts, peanut butter.  Foods moderate in purines: 80-90% lean beef, lamb, veal, pork, poultry, fish, eggs, peanut butter, nuts. Crab, lobster, oysters, and shrimp. Cooked dried beans, peas, and lentils.  Foods high in purines: Anchovies, sardines, herring, mussels, tuna, codfish, scallops, trout, and haddock. Tomasa BlaseBacon. Organ meats (such as liver or kidney). Tripe. Game meat. Goose. Sweetbreads. Dairy  All dairy foods are low in purines. Low-fat and fat-free dairy products are best because they are low in saturated fat. Beverages  Drinks low in purines: Water, carbonated beverages, tea, coffee, cocoa.  Drinks moderate in purines: Soft drinks and other drinks sweetened with high-fructose corn syrup. Juices. To find whether a food or drink is sweetened with high-fructose corn syrup, look at the ingredients list.  Drinks high in purines: Alcoholic beverages (such as beer). Condiments  Foods low in  purines: Salt, herbs, olives, pickles, relishes, vinegar.  Foods moderate in purines: Butter, margarine, oils, mayonnaise. Fats and Oils  Foods low in purines: All types, except gravies and sauces made with meat.  Foods high in purines: Gravies and sauces made with meat. Other Foods  Foods low in purines: Sugars, sweets, gelatin. Cake. Soups made without meat.  Foods moderate in purines:  Meat-based or fish-based soups, broths, or bouillons. Foods and drinks sweetened with high-fructose corn syrup.  Foods high in purines: High-fat desserts (such as ice cream, cookies, cakes, pies, doughnuts, and chocolate). Contact your dietitian for more information on foods that are not listed here.   This information is not intended to replace advice given to you by your health care provider. Make sure you discuss any questions you have with your health care provider.   Document Released: 07/22/2010 Document Revised: 04/01/2013 Document Reviewed: 03/03/2013 Elsevier Interactive Patient Education [employer].

## 2016-01-10 ENCOUNTER — Encounter: Payer: BLUE CROSS/BLUE SHIELD | Admitting: Family Medicine

## 2016-01-12 ENCOUNTER — Encounter: Payer: Self-pay | Admitting: Family Medicine

## 2016-01-12 ENCOUNTER — Ambulatory Visit (INDEPENDENT_AMBULATORY_CARE_PROVIDER_SITE_OTHER): Payer: BLUE CROSS/BLUE SHIELD | Admitting: Family Medicine

## 2016-01-12 ENCOUNTER — Telehealth: Payer: Self-pay

## 2016-01-12 VITALS — BP 134/93 | HR 80 | Wt 233.0 lb

## 2016-01-12 DIAGNOSIS — M25561 Pain in right knee: Secondary | ICD-10-CM | POA: Diagnosis not present

## 2016-01-12 DIAGNOSIS — M10072 Idiopathic gout, left ankle and foot: Secondary | ICD-10-CM | POA: Diagnosis not present

## 2016-01-12 DIAGNOSIS — Z0000 Encounter for general adult medical examination without abnormal findings: Secondary | ICD-10-CM | POA: Diagnosis not present

## 2016-01-12 DIAGNOSIS — Z23 Encounter for immunization: Secondary | ICD-10-CM

## 2016-01-12 MED ORDER — MUPIROCIN 2 % EX OINT
TOPICAL_OINTMENT | CUTANEOUS | 3 refills | Status: DC
Start: 2016-01-12 — End: 2016-10-16

## 2016-01-12 NOTE — Telephone Encounter (Signed)
Pt called stating that he would like to proceed with having the MRI. Please place order if appropriate.
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
## 2016-01-12 NOTE — Patient Instructions (Addendum)
Thank you for coming in today. Apply the ointment twice daily to the knee.   Continue uloric.  Work on weight loss and diet.  Use myfitness pal and let me know if you want to see a Dietician.   For the knee do straight leg raises and toe out straight leg raises 30 reps 2x daily.    Let me know if you want to do a MRI.

## 2016-01-13 NOTE — Telephone Encounter (Signed)
MRI ordered

## 2016-01-17 ENCOUNTER — Ambulatory Visit (INDEPENDENT_AMBULATORY_CARE_PROVIDER_SITE_OTHER): Payer: BLUE CROSS/BLUE SHIELD

## 2016-01-17 DIAGNOSIS — M25561 Pain in right knee: Secondary | ICD-10-CM

## 2016-01-24 ENCOUNTER — Encounter: Payer: Self-pay | Admitting: Family Medicine

## 2016-01-24 ENCOUNTER — Ambulatory Visit (INDEPENDENT_AMBULATORY_CARE_PROVIDER_SITE_OTHER): Payer: BLUE CROSS/BLUE SHIELD | Admitting: Family Medicine

## 2016-01-24 VITALS — BP 128/78 | HR 73 | Wt 236.0 lb

## 2016-01-24 DIAGNOSIS — M2241 Chondromalacia patellae, right knee: Secondary | ICD-10-CM | POA: Diagnosis not present

## 2016-01-24 DIAGNOSIS — M25561 Pain in right knee: Secondary | ICD-10-CM | POA: Diagnosis not present

## 2016-01-24 MED ORDER — DICLOFENAC SODIUM 1 % TD GEL: 4.0000 g | Freq: Four times a day (QID) | TRANSDERMAL | 11 refills | Status: DC

## 2016-01-24 NOTE — Patient Instructions (Signed)
Thank you for coming in today. Continue weight loss and quad strengthening. Attend physical therapy if not better.
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
## 2016-03-24 ENCOUNTER — Other Ambulatory Visit: Payer: Self-pay | Admitting: Family Medicine

## 2016-03-24 DIAGNOSIS — M10072 Idiopathic gout, left ankle and foot: Secondary | ICD-10-CM

## 2016-04-04 DIAGNOSIS — M1A09X1 Idiopathic chronic gout, multiple sites, with tophus (tophi): Secondary | ICD-10-CM | POA: Diagnosis not present

## 2016-10-16 ENCOUNTER — Encounter: Payer: Self-pay | Admitting: Family Medicine

## 2016-10-16 ENCOUNTER — Ambulatory Visit (INDEPENDENT_AMBULATORY_CARE_PROVIDER_SITE_OTHER): Payer: BLUE CROSS/BLUE SHIELD | Admitting: Family Medicine

## 2016-10-16 VITALS — BP 139/89 | HR 84 | Wt 245.0 lb

## 2016-10-16 DIAGNOSIS — S39012A Strain of muscle, fascia and tendon of lower back, initial encounter: Secondary | ICD-10-CM

## 2016-10-16 LAB — POCT URINALYSIS DIPSTICK
BILIRUBIN UA: NEGATIVE
Glucose, UA: NEGATIVE
KETONES UA: NEGATIVE
Leukocytes, UA: NEGATIVE
Nitrite, UA: NEGATIVE
PH UA: 5.5 (ref 5.0–8.0)
PROTEIN UA: NEGATIVE
RBC UA: NEGATIVE
Spec Grav, UA: 1.02 (ref 1.010–1.025)
Urobilinogen, UA: 0.2 E.U./dL

## 2016-10-16 MED ORDER — CYCLOBENZAPRINE HCL 10 MG PO TABS: 10.0000 mg | ORAL_TABLET | Freq: Every day | ORAL | 0 refills | Status: DC

## 2016-10-16 MED ORDER — NAPROXEN 500 MG PO TABS: 500.0000 mg | ORAL_TABLET | Freq: Two times a day (BID) | ORAL | 0 refills | Status: DC

## 2016-10-16 MED ORDER — OMEPRAZOLE 40 MG PO CPDR: 40.0000 mg | DELAYED_RELEASE_CAPSULE | Freq: Every day | ORAL | 3 refills | Status: DC

## 2016-10-16 NOTE — Patient Instructions (Signed)
Thank you for coming in today. Take naproxen twice daily.  Take omeprazole daily for stomach acid prevention.  Take flexeril at bedtime for muscle spasm.   TENS UNIT: This is helpful for muscle pain and spasm.   Search and Purchase a TENS 7000 2nd edition at  www.tenspros.com or www.Amazon.com It should be less than $30.     TENS unit instructions: Do not shower or bathe with the unit on Turn the unit off before removing electrodes or batteries If the electrodes lose stickiness add a drop of water to the electrodes after they are disconnected from the unit and place on plastic sheet. If you continued to have difficulty, call the TENS unit company to purchase more electrodes. Do not apply lotion on the skin area prior to use. Make sure the skin is clean and dry as this will help prolong the life of the electrodes. After use, always check skin for unusual red areas, rash or other skin difficulties. If there are any skin problems, does not apply electrodes to the same area. Never remove the electrodes from the unit by pulling the wires. Do not use the TENS unit or electrodes other than as directed. Do not change electrode placement without consultating your therapist or physician. Keep 2 fingers with between each electrode. Wear time ratio is 2:1, on to off times.    For example on for 30 minutes off for 15 minutes and then on for 30 minutes off for 15 minutes     Lumbosacral Strain Lumbosacral strain is an injury that causes pain in the lower back (lumbosacral spine). This injury usually occurs from overstretching the muscles or ligaments along your spine. A strain can affect one or more muscles or cord-like tissues that connect bones to other bones (ligaments). What are the causes? This condition may be caused by:  A hard, direct hit (blow) to the back.  Excessive stretching of the lower back muscles. This may result from: ? A fall. ? Lifting something heavy. ? Repetitive  movements such as bending or crouching.  What increases the risk? The following factors may increase your risk of getting this condition:  Participating in sports or activities that involve: ? A sudden twist of the back. ? Pushing or pulling motions.  Being overweight or obese.  Having poor strength and flexibility, especially tight hamstrings or weak muscles in the back or abdomen.  Having too much of a curve in the lower back.  Having a pelvis that is tilted forward.  What are the signs or symptoms? The main symptom of this condition is pain in the lower back, at the site of the strain. Pain may extend (radiate) down one or both legs. How is this diagnosed? This condition is diagnosed based on:  Your symptoms.  Your medical history.  A physical exam. ? Your health care provider may push on certain areas of your back to determine the source of your pain. ? You may be asked to bend forward, backward, and side to side to assess the severity of your pain and your range of motion.  Imaging tests, such as: ? X-rays. ? MRI.  How is this treated? Treatment for this condition may include:  Putting heat and cold on the affected area.  Medicines to help relieve pain and relax your muscles (muscle relaxants).  NSAIDs to help reduce swelling and discomfort.  When your symptoms improve, it is important to gradually return to your normal routine as soon as possible to reduce pain,  avoid stiffness, and avoid loss of muscle strength. Generally, symptoms should improve within 6 weeks of treatment. However, recovery time varies. Follow these instructions at home: Managing pain, stiffness, and swelling   If directed, put ice on the injured area during the first 24 hours after your strain. ? Put ice in a plastic bag. ? Place a towel between your skin and the bag. ? Leave the ice on for 20 minutes, 2-3 times a day.  If directed, put heat on the affected area as often as told by your  health care provider. Use the heat source that your health care provider recommends, such as a moist heat pack or a heating pad. ? Place a towel between your skin and the heat source. ? Leave the heat on for 20-30 minutes. ? Remove the heat if your skin turns bright red. This is especially important if you are unable to feel pain, heat, or cold. You may have a greater risk of getting burned. Activity  Rest and return to your normal activities as told by your health care provider. Ask your health care provider what activities are safe for you.  Avoid activities that take a lot of energy for as long as told by your health care provider. General instructions  Take over-the-counter and prescription medicines only as told by your health care provider.  Donot drive or use heavy machinery while taking prescription pain medicine.  Do not use any products that contain nicotine or tobacco, such as cigarettes and e-cigarettes. If you need help quitting, ask your health care provider.  Keep all follow-up visits as told by your health care provider. This is important. How is this prevented?  Use correct form when playing sports and lifting heavy objects.  Use good posture when sitting and standing.  Maintain a healthy weight.  Sleep on a mattress with medium firmness to support your back.  Be safe and responsible while being active to avoid falls.  Do at least 150 minutes of moderate-intensity exercise each week, such as brisk walking or water aerobics. Try a form of exercise that takes stress off your back, such as swimming or stationary cycling.  Maintain physical fitness, including: ? Strength. ? Flexibility. ? Cardiovascular fitness. ? Endurance. Contact a health care provider if:  Your back pain does not improve after 6 weeks of treatment.  Your symptoms get worse. Get help right away if:  Your back pain is severe.  You cannot stand or walk.  You have difficulty controlling  when you urinate or when you have a bowel movement.  You feel nauseous or you vomit.  Your feet get very cold.  You have numbness, tingling, weakness, or problems using your arms or legs.  You develop any of the following: ? Shortness of breath. ? Dizziness. ? Pain in your legs. ? Weakness in your buttocks or legs. ? Discoloration of the skin on your toes or legs. This information is not intended to replace advice given to you by your health care provider. Make sure you discuss any questions you have with your health care provider. Document Released: 01/04/2005 Document Revised: 10/15/2015 Document Reviewed: 08/29/2015 Elsevier Interactive Patient Education  2017 [employer].

## 2016-10-16 NOTE — Progress Notes (Signed)
Connor Jefferson is a 36 y.o. male who [facility] today for back pain. Patient is a 3 week history of right-sided low back pain. He notes some pain radiating to the posterior thigh but not past the knee. He denies any weakness or numbness bowel bladder dysfunction fevers or chills. He denies any dysuria. Pain is worse with activity and better with rest. He's tried some ibuprofen and Tiger balm which has helped a little.   Past Medical History:  Diagnosis Date  . Hx of appendectomy    Past Surgical History:  Procedure Laterality Date  . APPENDECTOMY     Social History  Substance Use Topics  . Smoking status: Never Smoker  . Smokeless tobacco: Never Used  . Alcohol use No     ROS:  As above   Medications: Current Outpatient Prescriptions  Medication Sig Dispense Refill  . COLCRYS 0.6 MG tablet Take one tablet by mouth daily 90 tablet 0  . Febuxostat (ULORIC) 80 MG TABS Take by mouth.    Marland Kitchen. ibuprofen (ADVIL,MOTRIN) 600 MG tablet Take by mouth.    . cyclobenzaprine (FLEXERIL) 10 MG tablet Take 1 tablet (10 mg total) by mouth at bedtime. 30 tablet 0  . naproxen (NAPROSYN) 500 MG tablet Take 1 tablet (500 mg total) by mouth 2 (two) times daily with a meal. 30 tablet 0  . omeprazole (PRILOSEC) 40 MG capsule Take 1 capsule (40 mg total) by mouth daily. 30 capsule 3   No current facility-administered medications for this visit.    Allergies  Allergen Reactions  . Allopurinol Rash     Exam:  BP 139/89   Pulse 84   Wt 245 lb (111.1 kg)   SpO2 97%   BMI 36.18 kg/m  General: Well Developed, well nourished, and in no acute distress.  Neuro/Psych: Alert and oriented x3, extra-ocular muscles intact, able to move all 4 extremities, sensation grossly intact. Skin: Warm and dry, no rashes noted.  Respiratory: Not using accessory muscles, speaking in full sentences, trachea midline.  Cardiovascular: Pulses palpable, no  extremity edema. Abdomen: Does not appear distended. MSK:  Spine nontender to spinal midline. Tender palpation right SI joint and paraspinal muscles and into the quadratus lumborum area on the right. Motion is intact but painful with left lateral flexion. Lower extremity strength sensation and reflexes are intact bilaterally. Negative slump test.     Results for orders placed or performed in visit on 10/16/16 (from the past 48 hour(s))  POCT Urinalysis Dipstick     Status: Normal   Collection Time: 10/16/16  9:10 AM  Result Value Ref Range   Color, UA yellow    Clarity, UA clear    Glucose, UA negative    Bilirubin, UA negative    Ketones, UA negative    Spec Grav, UA 1.020 1.010 - 1.025   Blood, UA negative    pH, UA 5.5 5.0 - 8.0   Protein, UA negative    Urobilinogen, UA 0.2 0.2 or 1.0 E.U./dL   Nitrite, UA negative    Leukocytes, UA Negative Negative   No results found.    Assessment and Plan: 36 y.o. male with Lumbosacral strain with myofascial disruption. Plan to treat with naproxen and Flexeril heating pad and TENS unit. Additionally refer to physical therapy. Omeprazole for GI prophylaxis on naproxen.    Orders Placed This Encounter  Procedures  . Ambulatory referral to Physical Therapy    Referral Priority:  Routine    Referral Type:   Physical Medicine    Referral Reason:   Specialty Services Required    Requested Specialty:   Physical Therapy  . POCT Urinalysis Dipstick   Meds ordered this encounter  Medications  . naproxen (NAPROSYN) 500 MG tablet    Sig: Take 1 tablet (500 mg total) by mouth 2 (two) times daily with a meal.    Dispense:  30 tablet    Refill:  0  . cyclobenzaprine (FLEXERIL) 10 MG tablet    Sig: Take 1 tablet (10 mg total) by mouth at bedtime.    Dispense:  30 tablet    Refill:  0  . omeprazole (PRILOSEC) 40 MG capsule    Sig: Take 1 capsule (40 mg total) by mouth daily.    Dispense:  30 capsule    Refill:  3    Discussed  warning signs or symptoms. Please see discharge instructions. Patient expresses understanding.

## 2016-10-24 ENCOUNTER — Ambulatory Visit: Payer: BLUE CROSS/BLUE SHIELD | Admitting: Rehabilitative and Restorative Service Providers"

## 2016-10-31 ENCOUNTER — Encounter: Payer: Self-pay | Admitting: Rehabilitative and Restorative Service Providers"

## 2016-10-31 ENCOUNTER — Ambulatory Visit (INDEPENDENT_AMBULATORY_CARE_PROVIDER_SITE_OTHER): Payer: BLUE CROSS/BLUE SHIELD | Admitting: Rehabilitative and Restorative Service Providers"

## 2016-10-31 DIAGNOSIS — M545 Low back pain, unspecified: Secondary | ICD-10-CM

## 2016-10-31 DIAGNOSIS — R29898 Other symptoms and signs involving the musculoskeletal system: Secondary | ICD-10-CM

## 2016-10-31 NOTE — Patient Instructions (Signed)
Stretch hand; massage webspace; cushion tools  Do not stand with knees locked or hyperextended   Trunk: Prone Extension (Press-Ups)    Lie on stomach on firm, flat surface. Relax bottom and legs. Raise chest in air with elbows straight. Keep hips flat on surface, sag stomach. Hold _2-3 ___ seconds. Repeat __10__ times. Do __2__ sessions per day. CAUTION: Movement should be gentle and slow.   Trunk Extension    Standing, place back of open hands on low back. Straighten spine then arch the back and move shoulders back. Repeat __2-3 __ times per session. Do _several___ sessions per day.  HIP: Hamstrings - Supine   Place strap around foot. Raise leg up, keeping knee straight.  Bend opposite knee to protect back if indicated. Hold 30 seconds. 3 reps per set, 2-3 sets per day     Outer Hip Stretch: Reclined IT Band Stretch (Strap)   Strap around one foot, pull leg across body until you feel a pull or stretch, with shoulders on mat. Hold for 30 seconds. Repeat 3 times each leg. 2-3 times/day.  Piriformis Stretch   Lying on back, pull right knee toward opposite shoulder. Hold 30 seconds. Repeat 3 times. Do 2-3 sessions per day.      Abdominal Bracing With Pelvic Floor (Hook-Lying)    With neutral spine, tighten pelvic floor and abdominals sucking belly button to back bone; tighten muscles in low back at waist. Hold 10 sec Repeat _10__ times. Do __several _ times a day. Progress to do this in sitting standing and walking as well as with functional activities  Sleeping on Back  Place pillow under knees. A pillow with cervical support and a roll around waist are also helpful. Copyright  VHI. All rights reserved.  Sleeping on Side Place pillow between knees. Use cervical support under neck and a roll around waist as needed. Copyright  VHI. All rights reserved.   Sleeping on Stomach   If this is the only desirable sleeping position, place pillow under lower legs,  and under stomach or chest as needed.  Posture - Sitting   Sit upright, head facing forward. Try using a roll to support lower back. Keep shoulders relaxed, and avoid rounded back. Keep hips level with knees. Avoid crossing legs for long periods. Stand to Sit / Sit to Stand   To sit: Bend knees to lower self onto front edge of chair, then scoot back on seat. To stand: Reverse sequence by placing one foot forward, and scoot to front of seat. Use rocking motion to stand up.   Work Height and Reach  Ideal work height is no more than 2 to 4 inches below elbow level when standing, and at elbow level when sitting. Reaching should be limited to arm's length, with elbows slightly bent.  Bending  Bend at hips and knees, not back. Keep feet shoulder-width apart.    Posture - Standing   Good posture is important. Avoid slouching and forward head thrust. Maintain curve in low back and align ears over shoul- ders, hips over ankles.  Alternating Positions   Alternate tasks and change positions frequently to reduce fatigue and muscle tension. Take rest breaks. Computer Work   Position work to Art gallery manager. Use proper work and seat height. Keep shoulders back and down, wrists straight, and elbows at right angles. Use chair that provides full back support. Add footrest and lumbar roll as needed.  Getting Into / Out of Car  Lower self onto seat, scoot back,  then bring in one leg at a time. Reverse sequence to get out.  Dressing  Lie on back to pull socks or slacks over feet, or sit and bend leg while keeping back straight.    Housework - Sink  Place one foot on ledge of cabinet under sink when standing at sink for prolonged periods.   Pushing / Pulling  Pushing is preferable to pulling. Keep back in proper alignment, and use leg muscles to do the work.  Deep Squat   Squat and lift with both arms held against upper trunk. Tighten stomach muscles without holding breath. Use  smooth movements to avoid jerking.  Avoid Twisting   Avoid twisting or bending back. Pivot around using foot movements, and bend at knees if needed when reaching for articles.  Carrying Luggage   Distribute weight evenly on both sides. Use a cart whenever possible. Do not twist trunk. Move body as a unit.   Lifting Principles .Maintain proper posture and head alignment. .Slide object as close as possible before lifting. .Move obstacles out of the way. .Test before lifting; ask for help if too heavy. .Tighten stomach muscles without holding breath. .Use smooth movements; do not jerk. .Use legs to do the work, and pivot with feet. .Distribute the work load symmetrically and close to the center of trunk. .Push instead of pull whenever possible.   Ask For Help   Ask for help and delegate to others when possible. Coordinate your movements when lifting together, and maintain the low back curve.  Log Roll   Lying on back, bend left knee and place left arm across chest. Roll all in one movement to the right. Reverse to roll to the left. Always move as one unit. Housework - Sweeping  Use long-handled equipment to avoid stooping.   Housework - Wiping  Position yourself as close as possible to reach work surface. Avoid straining your back.  Laundry - Unloading Wash   To unload small items at bottom of washer, lift leg opposite to arm being used to reach.  Gardening - Raking  Move close to area to be raked. Use arm movements to do the work. Keep back straight and avoid twisting.     Cart  When reaching into cart with one arm, lift opposite leg to keep back straight.   Getting Into / Out of Bed  Lower self to lie down on one side by raising legs and lowering head at the same time. Use arms to assist moving without twisting. Bend both knees to roll onto back if desired. To sit up, start from lying on side, and use same move-ments in reverse. Housework - Vacuuming  Hold  the vacuum with arm held at side. Step back and forth to move it, keeping head up. Avoid twisting.   Laundry - Armed forces training and education officerLoading Wash  Position laundry basket so that bending and twisting can be avoided.   Laundry - Unloading Dryer  Squat down to reach into clothes dryer or use a reacher.  Gardening - Weeding / Psychiatric nurselanting  Squat or Kneel. Knee pads may be helpful.
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
## 2016-11-08 ENCOUNTER — Telehealth: Payer: Self-pay | Admitting: Family Medicine

## 2016-11-08 DIAGNOSIS — H9190 Unspecified hearing loss, unspecified ear: Secondary | ICD-10-CM

## 2016-11-08 NOTE — Telephone Encounter (Signed)
Pt requested referral because he feels like he has some hearing loss. Pt reports his family has to repeat things often, and has to talk very loudly. Will place referral.

## 2016-11-08 NOTE — Telephone Encounter (Signed)
Patient called and would like for Dr.Corey to put a referral to [facility] and [facility] is  [phone], so please include this on the referral. Thanks

## 2016-11-13 DIAGNOSIS — H9190 Unspecified hearing loss, unspecified ear: Secondary | ICD-10-CM | POA: Diagnosis not present

## 2016-11-22 ENCOUNTER — Ambulatory Visit (INDEPENDENT_AMBULATORY_CARE_PROVIDER_SITE_OTHER): Payer: BLUE CROSS/BLUE SHIELD | Admitting: Family Medicine

## 2016-11-22 ENCOUNTER — Encounter (INDEPENDENT_AMBULATORY_CARE_PROVIDER_SITE_OTHER): Payer: Self-pay

## 2016-11-22 ENCOUNTER — Encounter: Payer: Self-pay | Admitting: Family Medicine

## 2016-11-22 VITALS — BP 133/91 | HR 81 | Wt 246.0 lb

## 2016-11-22 DIAGNOSIS — N451 Epididymitis: Secondary | ICD-10-CM

## 2016-11-22 MED ORDER — CEFDINIR 300 MG PO CAPS: 300.0000 mg | ORAL_CAPSULE | Freq: Two times a day (BID) | ORAL | 0 refills | Status: DC

## 2016-11-22 NOTE — Patient Instructions (Signed)
Thank you for coming in today. Take omnicef twice daily.  Return as needed.    Epididymitis Epididymitis is swelling (inflammation) of the epididymis. The epididymis is a cord-like structure that is located along the top and back part of the testicle. It collects and stores sperm from the testicle. This condition can also cause pain and swelling of the testicle and scrotum. Symptoms usually start suddenly (acute epididymitis). Sometimes epididymitis starts gradually and lasts for a while (chronic epididymitis). This type may be harder to treat. What are the causes? In men 1735 and younger, this condition is usually caused by a bacterial infection or sexually transmitted disease (STD), such as:  Gonorrhea.  Chlamydia.  In men 5635 and older who do not have anal sex, this condition is usually caused by bacteria from a blockage or abnormalities in the urinary system. These can result from:  Having a tube placed into the bladder (urinary catheter).  Having an enlarged or inflamed prostate gland.  Having recent urinary tract surgery.  In men who have a condition that weakens the body's defense system (immune system), such as HIV, this condition can be caused by:  Other bacteria, including tuberculosis and syphilis.  Viruses.  Fungi.  Sometimes this condition occurs without infection. That may happen if urine flows backward into the epididymis after heavy lifting or straining. What increases the risk? This condition is more likely to develop in men:  Who have unprotected sex with more than one partner.  Who have anal sex.  Who have recently had surgery.  Who have a urinary catheter.  Who have urinary problems.  Who have a suppressed immune system.  What are the signs or symptoms? This condition usually begins suddenly with chills, fever, and pain behind the scrotum and in the testicle. Other symptoms include:  Swelling of the scrotum, testicle, or both.  Pain  whenejaculatingor urinating.  Pain in the back or belly.  Nausea.  Itching and discharge from the penis.  Frequent need to pass urine.  Redness and tenderness of the scrotum.  How is this diagnosed? Your health care provider can diagnose this condition based on your symptoms and medical history. Your health care provider will also do a physical exam to ask about your symptoms and check your scrotum and testicle for swelling, pain, and redness. You may also have other tests, including:  Examination of discharge from the penis.  Urine tests for infections, such as STDs.  Your health care provider may test you for other STDs, including HIV. How is this treated? Treatment for this condition depends on the cause. If your condition is caused by a bacterial infection, oral antibiotic medicine may be prescribed. If the bacterial infection has spread to your blood, you may need to receive IV antibiotics. Nonbacterial epididymitis is treated with home care that includes bed rest and elevation of the scrotum. Surgery may be needed to treat:  Bacterial epididymitis that causes pus to build up in the scrotum (abscess).  Chronic epididymitis that has not responded to other treatments.  Follow these instructions at home: Medicines  Take over-the-counter and prescription medicines only as told by your health care provider.  If you were prescribed an antibiotic medicine, take it as told by your health care provider. Do not stop taking the antibiotic even if your condition improves. Sexual Activity  If your epididymitis was caused by an STD, avoid sexual activity until your treatment is complete.  Inform your sexual partner or partners if you test positive for  an STD. They may need to be treated.Do not engage in sexual activity with your partner or partners until their treatment is completed. General instructions  Return to your normal activities as told by your health care provider. Ask  your health care provider what activities are safe for you.  Keep your scrotum elevated and supported while resting. Ask your health care provider if you should wear a scrotal support, such as a jockstrap. Wear it as told by your health care provider.  If directed, apply ice to the affected area: ? Put ice in a plastic bag. ? Place a towel between your skin and the bag. ? Leave the ice on for 20 minutes, 2-3 times per day.  Try taking a sitz bath to help with discomfort. This is a warm water bath that is taken while you are sitting down. The water should only come up to your hips and should cover your buttocks. Do this 3-4 times per day or as told by your health care provider.  Keep all follow-up visits as told by your health care provider. This is important. Contact a health care provider if:  You have a fever.  Your pain medicine is not helping.  Your pain is getting worse.  Your symptoms do not improve within three days. This information is not intended to replace advice given to you by your health care provider. Make sure you discuss any questions you have with your health care provider. Document Released: 03/24/2000 Document Revised: 09/02/2015 Document Reviewed: 08/12/2014 Elsevier Interactive Patient Education  2018 [employer].

## 2016-11-22 NOTE — Progress Notes (Signed)
       Connor Jefferson is a 36 y.o. male who [name] today for left testicle pain. Patient has a several day history of left testicle pain associated with blood in semen. He notes symptoms are improving with ibuprofen. He denies any injury penile discharge fevers or chills vomiting or diarrhea.   Past Medical History:  Diagnosis Date  . Hx of appendectomy    Past Surgical History:  Procedure Laterality Date  . APPENDECTOMY     Social History  Substance Use Topics  . Smoking status: Never Smoker  . Smokeless tobacco: Never Used  . Alcohol use No   family history includes Diabetes in his paternal grandfather; Healthy in his father; Leukemia in his mother.  ROS as above:  Medications: Current Outpatient Prescriptions  Medication Sig Dispense Refill  . COLCRYS 0.6 MG tablet Take one tablet by mouth daily 90 tablet 0  . cyclobenzaprine (FLEXERIL) 10 MG tablet Take 1 tablet (10 mg total) by mouth at bedtime. 30 tablet 0  . Febuxostat (ULORIC) 80 MG TABS Take by mouth.    Marland Kitchen. ibuprofen (ADVIL,MOTRIN) 600 MG tablet Take by mouth.    . naproxen (NAPROSYN) 500 MG tablet Take 1 tablet (500 mg total) by mouth 2 (two) times daily with a meal. 30 tablet 0  . omeprazole (PRILOSEC) 40 MG capsule Take 1 capsule (40 mg total) by mouth daily. 30 capsule 3  . cefdinir (OMNICEF) 300 MG capsule Take 1 capsule (300 mg total) by mouth 2 (two) times daily. 14 capsule 0   No current facility-administered medications for this visit.    Allergies  Allergen Reactions  . Allopurinol Rash    Health Maintenance Health Maintenance  Topic Date Due  . INFLUENZA VACCINE  11/08/2016  . TETANUS/TDAP  01/01/2022  . HIV Screening  Completed     Exam:  BP (!) 133/91   Pulse 81   Wt 246 lb (111.6 kg)   BMI 36.33 kg/m  Gen: Well NAD HEENT: EOMI,  MMM Lungs: Normal work of  breathing. CTABL Heart: RRR no MRG Abd: NABS, Soft. Nondistended, Nontender Exts: Brisk capillary refill, warm and well perfused.  Genitals: Testicles are descended bilaterally. Right testicle is nontender with no masses. Left testicle has tender palpation at the epididymis. No hernias present bilaterally.   No results found for this or any previous visit (from the past 72 hour(s)). No results found.    Assessment and Plan: 36 y.o. male with epididymitis likely. Urine culture and GC chlamydia pending. Empiric treatment with Omnicef. Recheck as needed. New diagnosis uncertain prognosis require monitoring.   Orders Placed This Encounter  Procedures  . Urine Culture  . GC/Chlamydia Probe Amp   Meds ordered this encounter  Medications  . cefdinir (OMNICEF) 300 MG capsule    Sig: Take 1 capsule (300 mg total) by mouth 2 (two) times daily.    Dispense:  14 capsule    Refill:  0     Discussed warning signs or symptoms. Please see discharge instructions. Patient expresses understanding.

## 2016-11-23 LAB — GC/CHLAMYDIA PROBE AMP
CT PROBE, AMP APTIMA: NOT DETECTED
GC Probe RNA: NOT DETECTED

## 2016-11-23 LAB — URINE CULTURE: ORGANISM ID, BACTERIA: NO GROWTH

## 2017-03-23 ENCOUNTER — Ambulatory Visit: Payer: BLUE CROSS/BLUE SHIELD | Admitting: Family Medicine

## 2017-03-23 ENCOUNTER — Encounter: Payer: Self-pay | Admitting: Family Medicine

## 2017-03-23 VITALS — BP 140/83 | HR 92 | Ht 68.0 in | Wt 251.0 lb

## 2017-03-23 DIAGNOSIS — M25561 Pain in right knee: Secondary | ICD-10-CM

## 2017-03-23 NOTE — Progress Notes (Signed)
Connor Jefferson is a 36 y.o. male who [name] today for right knee pain.  Remi DeterSamuel notes a acute pain in the right medial anterior knee.  He denies any particular injury but notes that when he flexes his knee with weightbearing and rotation he can reproduce the pain.  He denies any locking or catching or giving way.  The pain is quite severe yesterday and worse with activity.  He even had some limping yesterday because of the pain.  The pain has improved with ibuprofen and notes it is only present when he is able to reproduce it with that particular motion.  He thinks is getting better. He has a pertinent past medical history for an MRI in October 2017 that showed partial thickness cartilage loss at the medial femoral condyle and medial tibial plateau.    Past Medical History:  Diagnosis Date  . Hx of appendectomy    Past Surgical History:  Procedure Laterality Date  . APPENDECTOMY     Social History   Tobacco Use  . Smoking status: Never Smoker  . Smokeless tobacco: Never Used  Substance Use Topics  . Alcohol use: No   family history includes Diabetes in his paternal grandfather; Healthy in his father; Leukemia in his mother.  ROS as above:  Medications: Current Outpatient Medications  Medication Sig Dispense Refill  . COLCRYS 0.6 MG tablet Take one tablet by mouth daily 90 tablet 0  . cyclobenzaprine (FLEXERIL) 10 MG tablet Take 1 tablet (10 mg total) by mouth at bedtime. 30 tablet 0  . Febuxostat (ULORIC) 80 MG TABS Take by mouth.    Marland Kitchen. ibuprofen (ADVIL,MOTRIN) 600 MG tablet Take by mouth.    Marland Kitchen. omeprazole (PRILOSEC) 40 MG capsule Take 1 capsule (40 mg total) by mouth daily. 30 capsule 3   No current facility-administered medications for this visit.    Allergies  Allergen Reactions  . Allopurinol Rash    Health Maintenance Health  Maintenance  Topic Date Due  . INFLUENZA VACCINE  11/23/2017 (Originally 11/08/2016)  . TETANUS/TDAP  01/01/2022  . HIV Screening  Completed     Exam:  BP 140/83   Pulse 92   Ht 5\' 8"  (1.727 m)   Wt 251 lb (113.9 kg)   BMI 38.16 kg/m  Gen: Well NAD HEENT: EOMI,  MMM Lungs: Normal work of breathing. CTABL Heart: RRR no MRG Abd: NABS, Soft. Nondistended, Nontender Exts: Brisk capillary refill, warm and well perfused.  MSK: Right knee normal-appearing without effusion. Nontender. Range of motion 0-120 degrees with retropatellar crepitations. Stable ligamentous exam. Negative McMurray's testing. Positive Thessaly's test. Intact flexion and extension strength. Normal gait.   CLINICAL DATA:  No known injury.  Right knee pain.  EXAM: MRI OF THE RIGHT KNEE WITHOUT CONTRAST  TECHNIQUE: Multiplanar, multisequence MR imaging of the knee was performed. No intravenous contrast was administered.  COMPARISON:  None.  FINDINGS: MENISCI  Medial meniscus:  Intact.  Lateral meniscus:  Intact.  LIGAMENTS  Cruciates:  Intact ACL and PCL.  Collaterals: Medial collateral ligament is intact. Lateral collateral ligament complex is intact.  CARTILAGE  Patellofemoral: Severe cartilage fissuring of the patellar apex with subchondral reactive marrow changes.  Medial: Mild partial-thickness cartilage loss of the medial femoral condyle and medial tibial plateau.  Lateral:  No chondral defect.  Joint: Small joint effusion. Normal Hoffa's fat. No plical thickening.  Popliteal Fossa:  No Baker cyst.  Intact  popliteus tendon.  Extensor Mechanism: Intact quadriceps tendon and patellar tendon. Intact medial and lateral patellar retinaculum. Intact MPFL.  Bones:  No marrow signal abnormality.  No fracture or dislocation.  Other: No fluid collection or hematoma.  IMPRESSION: 1. 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 2. Mild partial-thickness cartilage loss of the medial femoral condyle and medial tibial plateau.   Electronically Signed   By: Elige KoHetal  Patel   On: 01/17/2016 11:31   Assessment and Plan: 36 y.o. male with right knee pain concerning for exacerbation of DJD or medial meniscus injury.  Patient is improving and does not have mechanical symptoms at this time.  I think a trial of watchful waiting is reasonable.  Plan for continued over-the-counter ibuprofen.  Continue GI prophylaxis with omeprazole.  Continue topical diclofenac gel.  Recheck as needed.  Patient declined the flu vaccine today.   No orders of the defined types were placed in this encounter.  No orders of the defined types were placed in this encounter.    Discussed warning signs or symptoms. Please see discharge instructions. Patient expresses understanding.

## 2017-03-23 NOTE — Patient Instructions (Signed)
Thank you for coming in today. Take ibuprofen and apply voltaren gel.  If not better return for shot.  This should settle down on its own.  Return sooner if needed.    Meniscus Tear A meniscus tear is a knee injury in which a piece of the meniscus is torn. The meniscus is a thick, rubbery, wedge-shaped cartilage in the knee. Two menisci are located in each knee. They sit between the upper bone (femur) and lower bone (tibia) that make up the knee joint. Each meniscus acts as a shock absorber for the knee. A torn meniscus is one of the most common types of knee injuries. This injury can range from mild to severe. Surgery may be needed for a severe tear. What are the causes? This injury may be caused by any squatting, twisting, or pivoting movement. Sports-related injuries are the most common cause. These often occur from:  Running and stopping suddenly.  Changing direction.  Being tackled or knocked off your feet.  As people get older, their meniscus gets thinner and weaker. In these people, tears can happen more easily, such as from climbing stairs. What increases the risk? This injury is more likely to happen to:  People who play contact sports.  Males.  People who are 3530?35 years of age.  What are the signs or symptoms? Symptoms of this injury include:  Knee pain, especially at the side of the knee joint. You may feel pain when the injury occurs, or you may only hear a pop and feel pain later.  A feeling that your knee is clicking, catching, locking, or giving way.  Not being able to fully bend or extend your knee.  Bruising or swelling in your knee.  How is this diagnosed? This injury may be diagnosed based on your symptoms and a physical exam. The physical exam may include:  Moving your knee in different ways.  Feeling for tenderness.  Listening for a clicking sound.  Checking if your knee locks or catches.  You may also have tests, such  as:  X-rays.  MRI.  A procedure to look inside your knee with a narrow surgical telescope (arthroscopy).  You may be referred to a knee specialist (orthopedic surgeon). How is this treated? Treatment for this injury depends on the severity of the tear. Treatment for a mild tear may include:  Rest.  Medicine to reduce pain and swelling. This is usually a nonsteroidal anti-inflammatory drug (NSAID).  A knee brace or an elastic sleeve or wrap.  Using crutches or a walker to keep weight off your knee and to help you walk.  Exercises to strengthen your knee (physical therapy).  You may need surgery if you have a severe tear or if other treatments are not working. Follow these instructions at home: Managing pain and swelling  Take over-the-counter and prescription medicines only as told by your health care provider.  If directed, apply ice to the injured area: ? Put ice in a plastic bag. ? Place a towel between your skin and the bag. ? Leave the ice on for 20 minutes, 2-3 times per day.  Raise (elevate) the injured area above the level of your heart while you are sitting or lying down. Activity  Do not use the injured limb to support your body weight until your health care provider says that you can. Use crutches or a walker as told by your health care provider.  Return to your normal activities as told by your health care provider.  Ask your health care provider what activities are safe for you.  Perform range-of-motion exercises only as told by your health care provider.  Begin doing exercises to strengthen your knee and leg muscles only as told by your health care provider. After you recover, your health care provider may recommend these exercises to help prevent another injury. General instructions  Use a knee brace or elastic wrap as told by your health care provider.  Keep all follow-up visits as told by your health care provider. This is important. Contact a health  care provider if:  You have a fever.  Your knee becomes red, tender, or swollen.  Your pain medicine is not helping.  Your symptoms get worse or do not improve after 2 weeks of home care. This information is not intended to replace advice given to you by your health care provider. Make sure you discuss any questions you have with your health care provider. Document Released: 06/17/2002 Document Revised: 09/02/2015 Document Reviewed: 07/20/2014 Elsevier Interactive Patient Education  [employer]2018 [employer]

## 2017-04-04 DIAGNOSIS — M1A09X1 Idiopathic chronic gout, multiple sites, with tophus (tophi): Secondary | ICD-10-CM | POA: Diagnosis not present
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
## 2017-06-13 ENCOUNTER — Encounter: Payer: Self-pay | Admitting: Family Medicine

## 2017-06-13 ENCOUNTER — Ambulatory Visit: Payer: BLUE CROSS/BLUE SHIELD | Admitting: Family Medicine

## 2017-06-13 VITALS — BP 166/112 | HR 80 | Temp 98.3°F | Ht 68.0 in | Wt 241.0 lb

## 2017-06-13 DIAGNOSIS — R109 Unspecified abdominal pain: Secondary | ICD-10-CM

## 2017-06-13 DIAGNOSIS — R030 Elevated blood-pressure reading, without diagnosis of hypertension: Secondary | ICD-10-CM | POA: Diagnosis not present

## 2017-06-13 DIAGNOSIS — R197 Diarrhea, unspecified: Secondary | ICD-10-CM | POA: Diagnosis not present

## 2017-06-13 MED ORDER — DIPHENOXYLATE-ATROPINE 2.5-0.025 MG PO TABS: 1.0000 | ORAL_TABLET | Freq: Four times a day (QID) | ORAL | 0 refills | Status: DC | PRN

## 2017-06-13 NOTE — Patient Instructions (Addendum)
Thank you for coming in today. Get labs and stool sample today.  Use lomotil for cramping and pain as needed.  I will contact you with lab results soon.  If ok to treat with antibiotics I will.   Report back to me in 1-2 days.  If worsening next step is CT scan.    Food Poisoning Food poisoning is an illness that is caused by eating or drinking contaminated foods or drinks. In most cases, food poisoning is mild and lasts 1-2 days. However, some cases can be serious, especially for people who have weak body defense (immune) systems, older people, children and infants, and pregnant women. What are the causes? Foods can become contaminated with viruses, bacteria, parasites, mold, or chemicals as a result of:  Poor personal hygiene, such as poor hand washing practices.  Storing food improperly, such as not refrigerating raw meat.  Using unclean surfaces for serving, preparing, and storing food.  Cooking or eating with unclean utensils.  If contaminated food is eaten, viruses, bacteria, or parasites can harm the intestine. This often causes severe diarrhea. The most common causes of food poisoning include:  Viruses, such as: ? Norovirus. ? Rotavirus.  Bacteria, such as: ? Salmonella. ? Listeria. ? E. coli (Escherichia coli).  Parasites, such as: ? Giardia. ? Toxoplasmosis.  What are the signs or symptoms? Symptoms may take several hours to appear after you consume contaminated food or drink. Symptoms include:  Nausea.  Vomiting.  Cramping.  Diarrhea.  Fever and chills.  Muscle aches.  Dehydration. Dehydration can cause you to be tired and thirsty, have a dry mouth, and urinate less frequently.  How is this diagnosed? Your health care provider can diagnose food poisoning with a medical history and physical exam. This will include asking you what you have recently eaten. You may also have tests, including:  Blood tests.  Stool tests.  How is this  treated? Treatment focuses on relieving your symptoms and making sure that you are hydrated. You may also be given medicines. In severe cases, hospitalization may be required and you may need to receive fluids through an IV tube. Follow these instructions at home: Eating and drinking   Drink enough fluids to keep your urine clear or pale yellow. You may need to drink small amounts of clear liquids frequently.  Avoid milk, caffeine, and alcohol.  Ask your health care provider for specific rehydration instructions.  Eat small, frequent meals rather than large meals. Medicines  Take over-the-counter and prescription medicines only as told by your health care provider. Ask your health care provider if you should continue to take any of your regular prescribed and over-the-counter medicines.  If you were prescribed an antibiotic medicine, take it as told by your health care provider. Do not stop taking the antibiotic even if you start to feel better. General instructions  Wash your hands thoroughly before you prepare food and after you go to the bathroom (use the toilet). Make sure people who live with you also wash their hands often.  Clean surfaces that you touch with a product that contains chlorine bleach.  Keep all follow-up visits as told by your health care provider. This is important. How is this prevented?  Wash your hands, food preparation surfaces, and utensils thoroughly before and after you handle raw foods.  Use separate food preparation surfaces and storage spaces for raw meat and for fruits and vegetables.  Keep refrigerated foods colder than 33F (5C).  Serve hot foods immediately or  keep them heated above 140F (60C).  Store dry foods in cool, dry spaces away from excess heat or moisture. Throw out any foods that do not smell right or are in cans that are bulging.  Follow approved canning procedures.  Heat canned foods thoroughly before you taste them.  Drink  bottled or sterile water when you travel. Get help right away if: Call 911 or go to the emergency room if:  You have difficulty breathing, swallowing, talking, or moving.  You develop blurred vision.  You cannot eat or drink without vomiting.  You faint.  Your eyes turn yellow.  Your vomiting or diarrhea is persistent.  Abdominal pain develops, increases, or localizes in one small area.  You have a fever.  You have blood or mucus in your stools, or your stools look dark black and tarry.  You have signs of dehydration, such as: ? Dark urine, very little urine, or no urine. ? Cracked lips. ? Not making tears while crying. ? Dry mouth. ? Sunken eyes. ? Sleepiness. ? Weakness. ? Dizziness.  This information is not intended to replace advice given to you by your health care provider. Make sure you discuss any questions you have with your health care provider. Document Released: 12/24/2003 Document Revised: 08/24/2015 Document Reviewed: 09/28/2014 Elsevier Interactive Patient Education  [employer].

## 2017-06-13 NOTE — Progress Notes (Signed)
Connor Jefferson is a 37 y.o. male who presents to [facility]: Primary Care Sports Medicine today for abdominal cramping and diarrhea.  Chasen notes a 5-day history of abdominal cramping associated with watery nonbloody diarrhea.  He notes symptoms started about 6 hours after eating a questionable hamburger.  He denies any vomiting but does note nausea.  No fevers or chills.  No melanotic stool.  Patient notes his surgical history significant for appendectomy.  He notes the pain does not feel consistent with bursitis.  He is tried multiple over-the-counter medications including loperamide with little effect.  No family members with similar illness.  No recent foreign travel or camping.    Past Medical History:  Diagnosis Date  . Hx of appendectomy    Past Surgical History:  Procedure Laterality Date  . APPENDECTOMY     Social History   Tobacco Use  . Smoking status: Never Smoker  . Smokeless tobacco: Never Used  Substance Use Topics  . Alcohol use: No   family history includes Diabetes in his paternal grandfather; Healthy in his father; Leukemia in his mother.  ROS as above:  Medications: Current Outpatient Medications  Medication Sig Dispense Refill  . COLCRYS 0.6 MG tablet Take one tablet by mouth daily 90 tablet 0  . cyclobenzaprine (FLEXERIL) 10 MG tablet Take 1 tablet (10 mg total) by mouth at bedtime. 30 tablet 0  . Febuxostat (ULORIC) 80 MG TABS Take by mouth.    Marland Kitchen ibuprofen (ADVIL,MOTRIN) 600 MG tablet Take by mouth.    Marland Kitchen omeprazole (PRILOSEC) 40 MG capsule Take 1 capsule (40 mg total) by mouth daily. 30 capsule 3  . diphenoxylate-atropine (LOMOTIL) 2.5-0.025 MG tablet Take 1 tablet by mouth 4 (four) times daily as needed for diarrhea or loose stools. 30 tablet 0   No current facility-administered medications for this visit.    Allergies  Allergen Reactions  . Allopurinol Rash     Health Maintenance Health Maintenance  Topic Date Due  . INFLUENZA VACCINE  11/23/2017 (Originally 11/08/2016)  . TETANUS/TDAP  01/01/2022  . HIV Screening  Completed     Exam:  BP (!) 166/112   Pulse 80   Temp 98.3 F (36.8 C) (Oral)   Ht 5\' 8"  (1.727 m)   Wt 241 lb (109.3 kg)   BMI 36.64 kg/m  Gen: Well NAD nontoxic appearing HEENT: EOMI,  MMM Lungs: Normal work of breathing. CTABL Heart: RRR no MRG Abd: NABS, Soft. Nondistended, minimally tender with no rebound or guarding or masses palpated Exts: Brisk capillary refill, warm and well perfused.    No results found for this or any previous visit (from the past 72 hour(s)). No results found.    Assessment and Plan: 37 y.o. male with  Abdominal cramping associated with watery diarrhea for 5 days.  Obviously concerning for infectious cause.  Plan to complete workup listed below including CBC lipase stool culture and stool lactoferrin.  Plan for empiric treatment with Lomotil.  If culture does not indicate E. Coli 0157 reasonable to treat with antibiotics.  Would likely hold off on antibiotic at this time given possibility of worsening with antibiotics.  Recheck in a few days if not improving/.  Blood pressure elevated today which is not typical.  Recheck in the near future.   Orders Placed This Encounter  Procedures  . Stool Culture  . CBC  . COMPLETE METABOLIC PANEL WITH GFR  . Lipase  . Stool, WBC/Lactoferrin  Meds ordered this encounter  Medications  . diphenoxylate-atropine (LOMOTIL) 2.5-0.025 MG tablet    Sig: Take 1 tablet by mouth 4 (four) times daily as needed for diarrhea or loose stools.    Dispense:  30 tablet    Refill:  0     Discussed warning signs or symptoms. Please see discharge instructions. Patient expresses understanding.

## 2017-06-14 DIAGNOSIS — R197 Diarrhea, unspecified: Secondary | ICD-10-CM | POA: Diagnosis not present

## 2017-06-14 DIAGNOSIS — R109 Unspecified abdominal pain: Secondary | ICD-10-CM | POA: Diagnosis not present

## 2017-06-14 LAB — COMPLETE METABOLIC PANEL WITH GFR
AG Ratio: 1.4 (calc) (ref 1.0–2.5)
ALBUMIN MSPROF: 5 g/dL (ref 3.6–5.1)
ALKALINE PHOSPHATASE (APISO): 80 U/L (ref 40–115)
ALT: 58 U/L — ABNORMAL HIGH (ref 9–46)
AST: 40 U/L (ref 10–40)
BILIRUBIN TOTAL: 0.5 mg/dL (ref 0.2–1.2)
BUN: 12 mg/dL (ref 7–25)
CHLORIDE: 102 mmol/L (ref 98–110)
CO2: 29 mmol/L (ref 20–32)
Calcium: 9.9 mg/dL (ref 8.6–10.3)
Creat: 1.12 mg/dL (ref 0.60–1.35)
GFR, Est African American: 98 mL/min/{1.73_m2} (ref >=60)
GFR, Est Non African American: 85 mL/min/{1.73_m2} (ref >=60)
GLUCOSE: 91 mg/dL (ref 65–99)
Globulin: 3.5 g/dL (calc) (ref 1.9–3.7)
Potassium: 4.2 mmol/L (ref 3.5–5.3)
Sodium: 140 mmol/L (ref 135–146)
Total Protein: 8.5 g/dL — ABNORMAL HIGH (ref 6.1–8.1)

## 2017-06-14 LAB — CBC
HEMATOCRIT: 50.7 % — AB (ref 38.5–50.0)
Hemoglobin: 17.9 g/dL — ABNORMAL HIGH (ref 13.2–17.1)
MCH: 30 pg (ref 27.0–33.0)
MCHC: 35.3 g/dL (ref 32.0–36.0)
MCV: 84.9 fL (ref 80.0–100.0)
MPV: 10.6 fL (ref 7.5–12.5)
Platelets: 246 10*3/uL (ref 140–400)
RBC: 5.97 10*6/uL — AB (ref 4.20–5.80)
RDW: 12.5 % (ref 11.0–15.0)
WBC: 7.7 10*3/uL (ref 3.8–10.8)

## 2017-06-14 LAB — LIPASE: LIPASE: 16 U/L (ref 7–60)

## 2017-06-15 LAB — FECAL LACTOFERRIN, QUANT
Fecal Lactoferrin: POSITIVE — AB
MICRO NUMBER:: 90295576
SPECIMEN QUALITY:: ADEQUATE

## 2017-06-18 LAB — STOOL CULTURE
MICRO NUMBER: 90295601
MICRO NUMBER: 90295603
MICRO NUMBER:: 90295602
SHIGA RESULT:: NOT DETECTED
SPECIMEN QUALITY: ADEQUATE
SPECIMEN QUALITY:: ADEQUATE
SPECIMEN QUALITY:: ADEQUATE

## 2017-08-04 IMAGING — DX DG KNEE COMPLETE 4+V*R*
4 series · 4 of 4 positions shown · non-contrast
Comparison: None.

CLINICAL DATA: Right knee pain for 2 weeks without known injury.

EXAM:
RIGHT KNEE - COMPLETE 4+ VIEW

[knee lat]
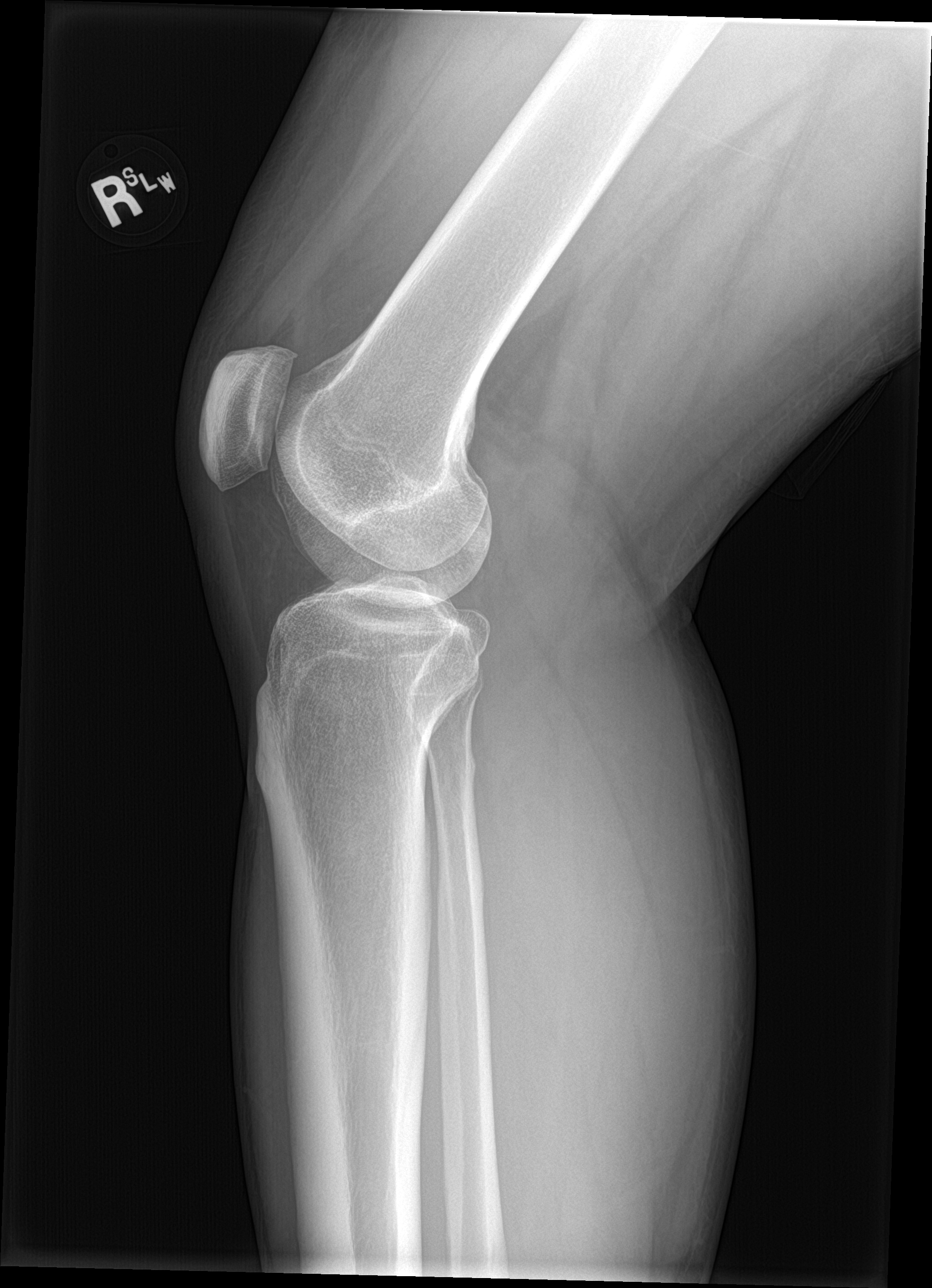

[knee sunrise]
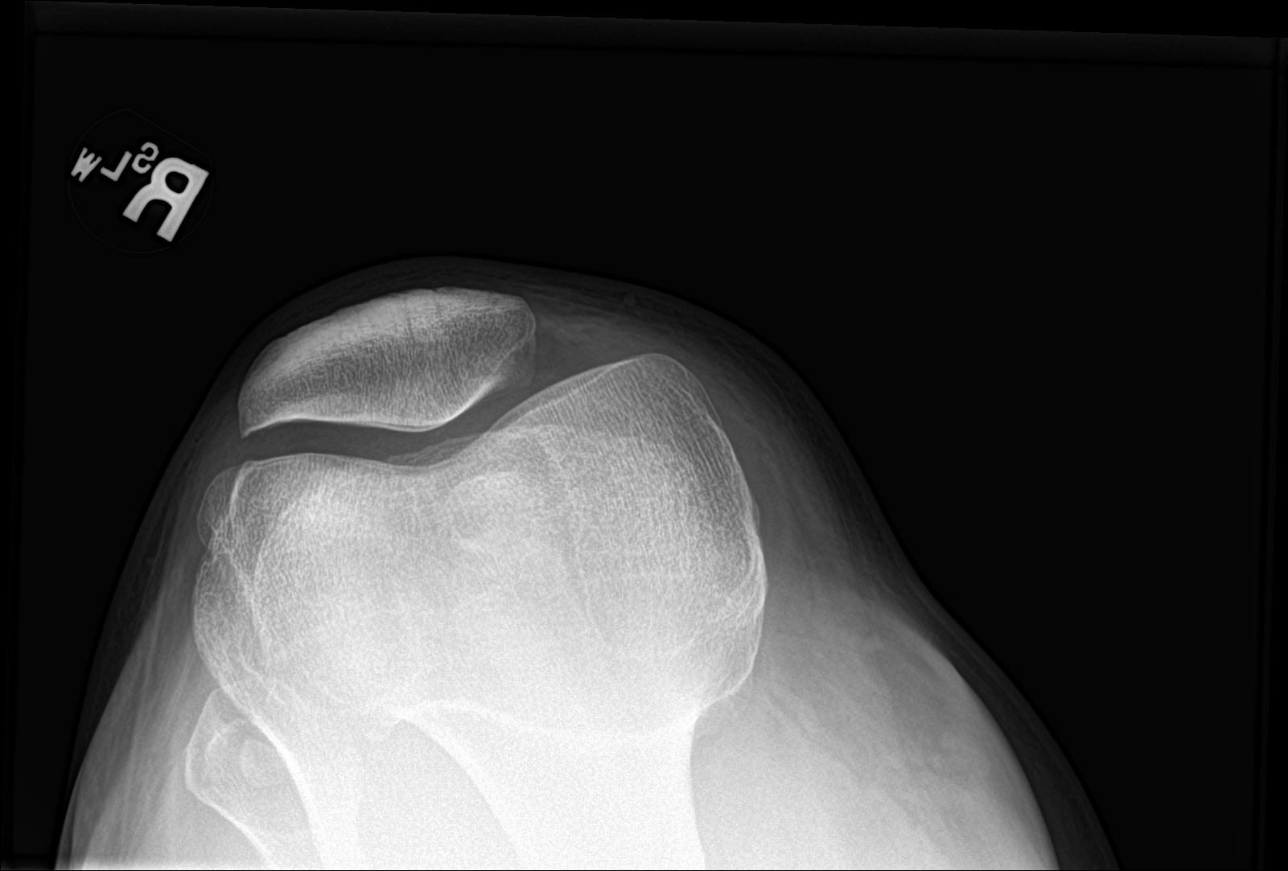

[knee ap bilat standing (1 of 2)]
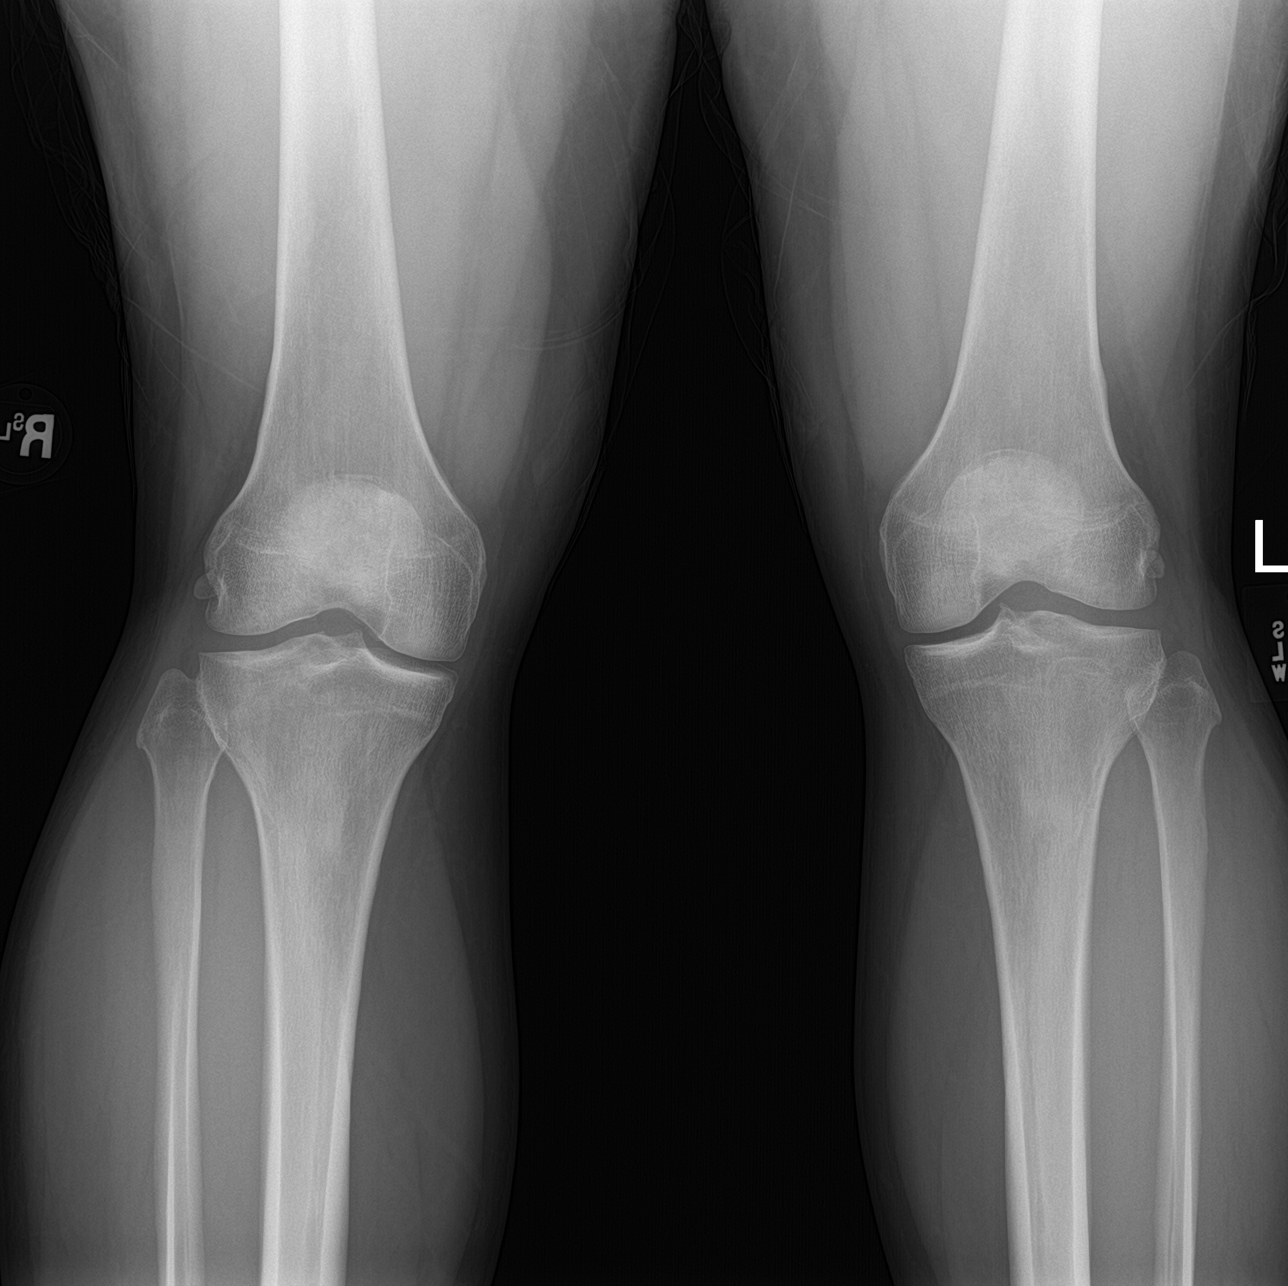

[knee ap bilat standing (2 of 2)]
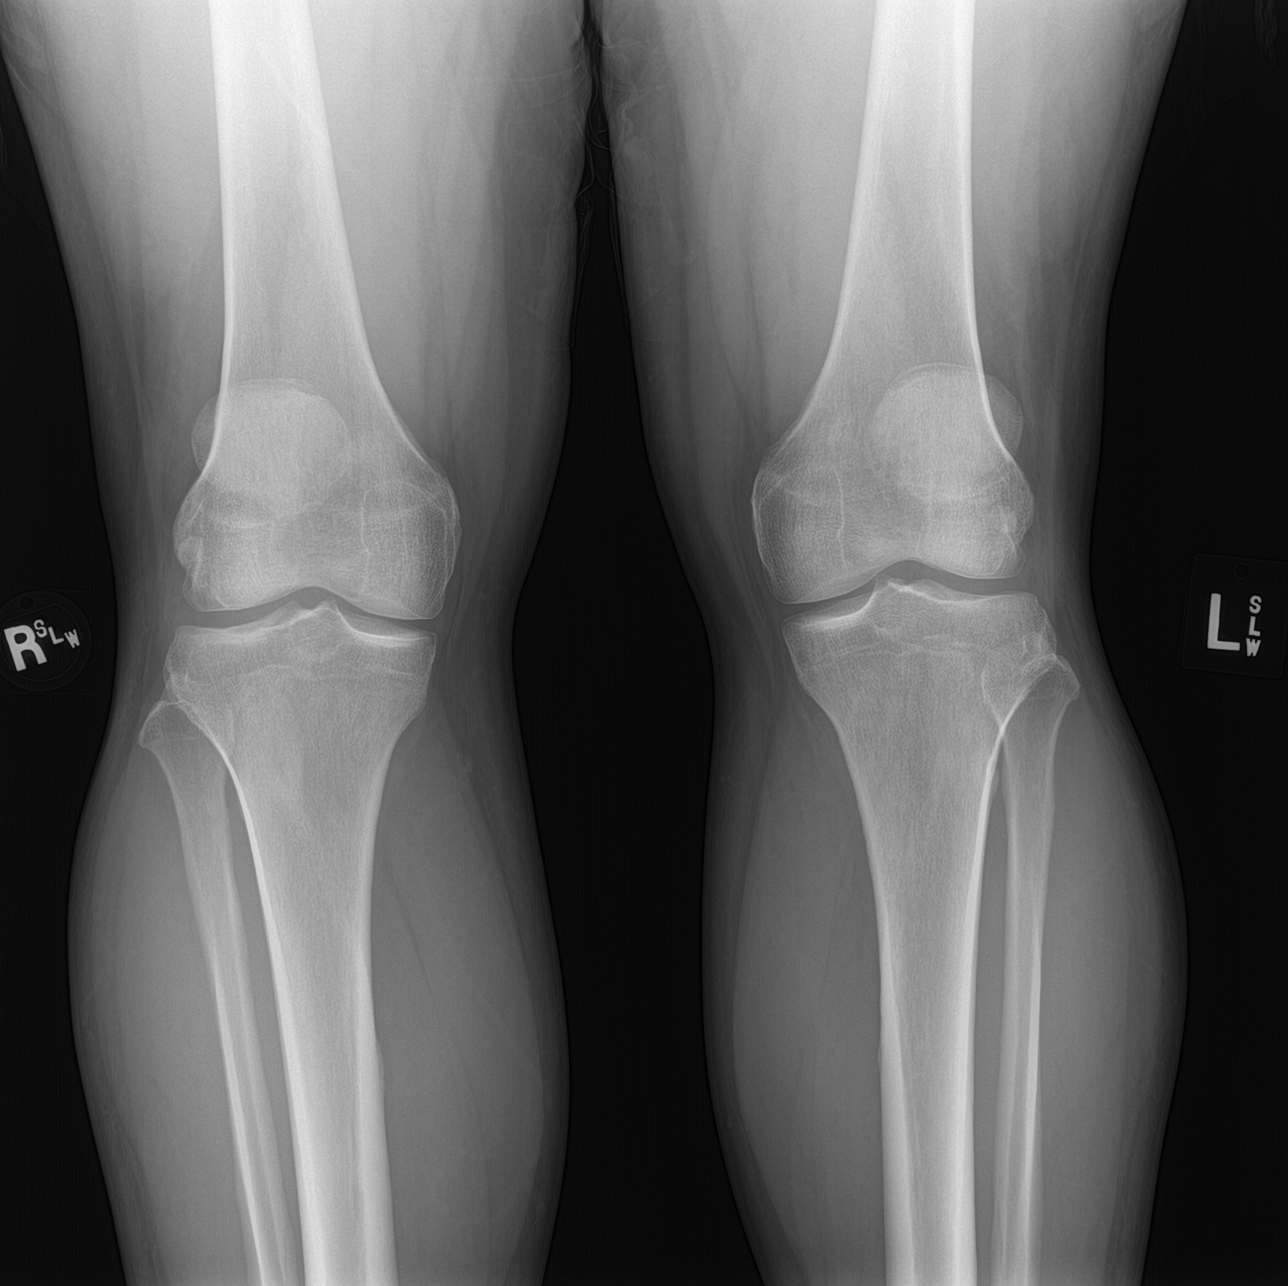

[4 of 4 positions shown; findings below may reference images not displayed]

FINDINGS: No evidence of fracture, dislocation, or joint effusion. No evidence
of arthropathy or other focal bone abnormality. Soft tissues are
unremarkable.
IMPRESSION: Normal right knee.

## 2017-08-13 ENCOUNTER — Encounter: Payer: Self-pay | Admitting: Physician Assistant

## 2017-08-13 ENCOUNTER — Ambulatory Visit: Payer: BLUE CROSS/BLUE SHIELD | Admitting: Physician Assistant

## 2017-08-13 VITALS — BP 126/82 | HR 98 | Temp 98.9°F | Ht 68.0 in | Wt 237.0 lb

## 2017-08-13 DIAGNOSIS — E010 Iodine-deficiency related diffuse (endemic) goiter: Secondary | ICD-10-CM | POA: Insufficient documentation

## 2017-08-13 DIAGNOSIS — J209 Acute bronchitis, unspecified: Secondary | ICD-10-CM | POA: Diagnosis not present

## 2017-08-13 MED ORDER — BENZONATATE 200 MG PO CAPS: 200.0000 mg | ORAL_CAPSULE | Freq: Two times a day (BID) | ORAL | 0 refills | Status: DC | PRN

## 2017-08-13 MED ORDER — PREDNISONE 50 MG PO TABS: ORAL_TABLET | ORAL | 0 refills | Status: DC

## 2017-08-13 MED ORDER — HYDROCODONE-HOMATROPINE 5-1.5 MG/5ML PO SYRP: 5.0000 mL | ORAL_SOLUTION | Freq: Two times a day (BID) | ORAL | 0 refills | Status: DC

## 2017-08-13 NOTE — Progress Notes (Signed)
Subjective:    Patient ID: Connor Jefferson, male    DOB: [birth year], 37 y.o.   MRN: [id number]  HPI 37 year old pleasant appearing male presents today with cough and congestion of 4 days duration. It all started with a stuffy nose and an itchy, irritated throat, which later progressed to a cough that has gotten worse. The cough is productive in nature with yellow sputum. He says he has not been able to sleep well due to the cough.He also has nasal congestion. He has also had headaches that he describes as migraines for the past 2 days. He has tried taking OTC antihistamine medication from [employer] as well as Mucinex-D, both of which provided him minimal relief. His son developed similar symptoms around the same time as him, but other than that denies sick contacts. He has back pain, shoulder pain, and throat pain, all of which he attributes to his cough. He denies fever, abdominal pain, ear pain.  .. Active Ambulatory Problems    Diagnosis Date Noted  . Hematospermia 01/07/2012  . Gout 03/24/2013  . Right knee pain 12/20/2015  . 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 Diarrhea 06/13/2017   Resolved Ambulatory Problems    Diagnosis Date Noted  . Folliculitis 01/07/2012  . Annual physical exam 01/07/2012  . Atypical chest pain 07/10/2012  . Pain of left great toe 01/30/2013  . Closed fracture of one rib of left side 09/14/2015   Past Medical History:  Diagnosis Date  . Hx of appendectomy       Review of Systems  All other systems reviewed and are negative.      Objective:   Physical Exam  Constitutional: He is oriented to person, place, and time. He appears well-developed and well-nourished.  HENT:  Head: Normocephalic and atraumatic.  Right Ear: Tympanic membrane, external ear and ear canal normal.  Left Ear: Tympanic membrane, external ear and ear canal normal.  Mouth/Throat: Oropharynx is clear and moist.  TM's clear.  Negative Tenderness over  maxillary/frontal sinuses.   Eyes: Pupils are equal, round, and reactive to light. EOM are normal.  Neck: Normal range of motion.  Bilateral enlargement around thyroid. Left side greater than right.   Cardiovascular: Normal rate, regular rhythm and normal heart sounds.  Pulmonary/Chest: Effort normal and breath sounds normal. No respiratory distress. He has no wheezes.  Lymphadenopathy:    Cervical adenopathy: anterior.  Neurological: He is alert and oriented to person, place, and time.  Skin: Skin is warm and dry.  Psychiatric: He has a normal mood and affect. His behavior is normal.      Assessment & Plan:  Marland KitchenMarland KitchenAyomide was seen today for cough and headache.  Diagnoses and all orders for this visit:  Acute bronchitis, unspecified organism -     benzonatate (TESSALON) 200 MG capsule; Take 1 capsule (200 mg total) by mouth 2 (two) times daily as needed for cough. -     predniSONE (DELTASONE) 50 MG tablet; One tab PO daily for 5 days. -     HYDROcodone-homatropine (HYCODAN) 5-1.5 MG/5ML syrup; Take 5 mLs by mouth every 12 (twelve) hours.  Thyromegaly  discussed likely viral/allergic etiology of cough.HO given. Reassurance lungs sounded great. Patient instructed to only take Hycodan cough medication at night in order to sleep and not to take during the day while driving or at work. Not for work given. [place] controlled substance database reviewed without concern.  Instructed to call the clinic on Thursday if symptoms are not improving.  Instructed to follow up with PCP in about 2 weeks to reassess possible thyromegaly. No history of this.

## 2017-08-13 NOTE — Patient Instructions (Signed)

## 2017-08-17 ENCOUNTER — Ambulatory Visit: Payer: BLUE CROSS/BLUE SHIELD | Admitting: Family Medicine

## 2017-08-17 ENCOUNTER — Ambulatory Visit (HOSPITAL_BASED_OUTPATIENT_CLINIC_OR_DEPARTMENT_OTHER)
Admission: RE | Admit: 2017-08-17 | Discharge: 2017-08-17 | Disposition: A | Discharge status: Home / Self Care | Payer: BLUE CROSS/BLUE SHIELD | Source: Ambulatory Visit | Attending: Family Medicine | Admitting: Family Medicine

## 2017-08-17 VITALS — BP 134/87 | HR 83 | Wt 240.0 lb

## 2017-08-17 DIAGNOSIS — N503 Cyst of epididymis: Secondary | ICD-10-CM | POA: Insufficient documentation

## 2017-08-17 DIAGNOSIS — N492 Inflammatory disorders of scrotum: Secondary | ICD-10-CM | POA: Diagnosis not present

## 2017-08-17 DIAGNOSIS — N433 Hydrocele, unspecified: Secondary | ICD-10-CM | POA: Insufficient documentation

## 2017-08-17 DIAGNOSIS — J209 Acute bronchitis, unspecified: Secondary | ICD-10-CM

## 2017-08-17 DIAGNOSIS — N5089 Other specified disorders of the male genital organs: Secondary | ICD-10-CM | POA: Insufficient documentation

## 2017-08-17 MED ORDER — CEFDINIR 300 MG PO CAPS: 300.0000 mg | ORAL_CAPSULE | Freq: Two times a day (BID) | ORAL | 0 refills | Status: DC

## 2017-08-17 MED ORDER — HYDROCODONE-HOMATROPINE 5-1.5 MG/5ML PO SYRP: 5.0000 mL | ORAL_SOLUTION | Freq: Four times a day (QID) | ORAL | 0 refills | Status: DC | PRN

## 2017-08-17 NOTE — Progress Notes (Signed)
Connor Jefferson is a 37 y.o. male who presents to [facility]: Primary Care Sports Medicine today for testicle pain.  Connor Jefferson notes pain in the left testicle associated with a mass. He noted it about a week ago. He denies any injury or pain with urination.  He feels well with no fevers or chills nausea vomiting.  He notes that he was seen recently for bronchitis by 1 of my partners.  He notes he is feeling a lot better but continues to have a bit of a cough especially at nighttime.  He is about to run out of the Hycodan cough syrup that has been very effective.   Past Medical History:  Diagnosis Date  . Hx of appendectomy    Past Surgical History:  Procedure Laterality Date  . APPENDECTOMY     Social History   Tobacco Use  . Smoking status: Never Smoker  . Smokeless tobacco: Never Used  Substance Use Topics  . Alcohol use: No   family history includes Diabetes in his paternal grandfather; Healthy in his father; Leukemia in his mother.  ROS as above:  Medications: Current Outpatient Medications  Medication Sig Dispense Refill  . COLCRYS 0.6 MG tablet Take one tablet by mouth daily 90 tablet 0  . Febuxostat (ULORIC) 80 MG TABS Take by mouth.    Marland Kitchen HYDROcodone-homatropine (HYCODAN) 5-1.5 MG/5ML syrup Take 5 mLs by mouth every 6 (six) hours as needed for cough. 60 mL 0  . ibuprofen (ADVIL,MOTRIN) 600 MG tablet Take by mouth.    Marland Kitchen omeprazole (PRILOSEC) 40 MG capsule Take 1 capsule (40 mg total) by mouth daily. 30 capsule 3  . cefdinir (OMNICEF) 300 MG capsule Take 1 capsule (300 mg total) by mouth 2 (two) times daily. 14 capsule 0   No current facility-administered medications for this visit.    Allergies  Allergen Reactions  . Allopurinol Rash    Health Maintenance Health Maintenance  Topic Date Due  . INFLUENZA VACCINE  11/23/2017 (Originally 11/08/2017)  . TETANUS/TDAP  01/01/2022  . HIV  Screening  Completed     Exam:  BP 134/87   Pulse 83   Wt 240 lb (108.9 kg)   BMI 36.49 kg/m  Gen: Well NAD HEENT: EOMI,  MMM no significant cervical lymphadenopathy. Lungs: Normal work of breathing. CTABL Heart: RRR no MRG Abd: NABS, Soft. Nondistended, Nontender Exts: Brisk capillary refill, warm and well perfused.  Genitals: Testicles descended bilaterally.  Nodule inferior to the left testicle mildly tender present.  Right nontender with no nodules.  No hernias bilaterally.   No results found for this or any previous visit (from the past 72 hour(s)). No results found.    Assessment and Plan: 37 y.o. 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  However concerning enough that I have ordered a scrotal ultrasound to evaluate for testicular nodules.  Empiric treatment with Omnicef.  Bronchitis: Improving.  Refill Hycodan cough syrup.  Work note provided.  Recheck in the near future.   Orders Placed This Encounter  Procedures  . US SCROTUM W/DOPPLER    Standing Status:   Future    Standing Expiration Date:   10/18/2018    Order Specific Question:   Reason for Exam (SYMPTOM  OR DIAGNOSIS REQUIRED)    Answer:   eval left mass    Order Specific Question:   Preferred imaging location?    Answer:   [facility]  Meds ordered this encounter  Medications  . cefdinir (OMNICEF) 300 MG capsule    Sig: Take 1 capsule (300 mg total) by mouth 2 (two) times daily.    Dispense:  14 capsule    Refill:  0  . HYDROcodone-homatropine (HYCODAN) 5-1.5 MG/5ML syrup    Sig: Take 5 mLs by mouth every 6 (six) hours as needed for cough.    Dispense:  60 mL    Refill:  0    Patient researched [place] Controlled Substance Reporting System.  Discussed warning signs or symptoms. Please see discharge instructions. Patient expresses understanding.

## 2017-08-17 NOTE — Patient Instructions (Addendum)
Thank you for coming in today.  Get ultrasound today. At 130 Take omnicef antibiotics 2x daily.  Continue cough medicine at night as needed.  Do not take at work.   [facility]  [address] [place], [place] [address]   [phone]

## 2017-08-27 ENCOUNTER — Ambulatory Visit: Payer: BLUE CROSS/BLUE SHIELD | Admitting: Family Medicine

## 2017-09-28 ENCOUNTER — Ambulatory Visit: Payer: BLUE CROSS/BLUE SHIELD | Admitting: Family Medicine

## 2017-09-28 ENCOUNTER — Encounter: Payer: Self-pay | Admitting: Family Medicine

## 2017-09-28 ENCOUNTER — Ambulatory Visit (INDEPENDENT_AMBULATORY_CARE_PROVIDER_SITE_OTHER): Payer: BLUE CROSS/BLUE SHIELD

## 2017-09-28 VITALS — BP 131/91 | HR 79 | Wt 238.0 lb

## 2017-09-28 DIAGNOSIS — R053 Chronic cough: Secondary | ICD-10-CM

## 2017-09-28 DIAGNOSIS — M10072 Idiopathic gout, left ankle and foot: Secondary | ICD-10-CM

## 2017-09-28 DIAGNOSIS — R05 Cough: Secondary | ICD-10-CM | POA: Diagnosis not present

## 2017-09-28 MED ORDER — OMEPRAZOLE 40 MG PO CPDR: 40.0000 mg | DELAYED_RELEASE_CAPSULE | Freq: Every day | ORAL | 3 refills | Status: DC

## 2017-09-28 MED ORDER — SCOPOLAMINE 1 MG/3DAYS TD PT72: 1.0000 | MEDICATED_PATCH | TRANSDERMAL | 3 refills | Status: DC

## 2017-09-28 MED ORDER — ALBUTEROL SULFATE HFA 108 (90 BASE) MCG/ACT IN AERS: 2.0000 | INHALATION_SPRAY | Freq: Four times a day (QID) | RESPIRATORY_TRACT | 0 refills | Status: DC | PRN

## 2017-09-28 MED ORDER — FLUTICASONE PROPIONATE 50 MCG/ACT NA SUSP: 2.0000 | Freq: Every day | NASAL | 2 refills | Status: DC

## 2017-09-28 NOTE — Progress Notes (Signed)
Connor Jefferson is a 37 y.o. male who [facility] Connor Jefferson: Primary Care Sports Medicine today for chronic cough.  Connor Jefferson has had a chronic cough now for greater than 6 weeks.  He notes it is associated with some postnasal drainage and congestion.  He denies severe shortness of breath.  He denies severe acid reflux symptoms.  No chest pain palpitations nausea vomiting or diarrhea.  He is tried some over-the-counter medications which have not helped a whole lot.  Additionally Connor Jefferson ask about Uloric.  He notes that apparently the manufacture coupon card program is going to expire and his medication will be more expensive.  He is significantly intolerant of allopurinol causing significant myalgias.   ROS as above:  Exam:  BP (!) 131/91   Pulse 79   Wt 238 lb (108 kg)   BMI 36.19 kg/m  Gen: Well NAD HEENT: EOMI,  MMM inflamed nasal turbinates bilaterally.  Posterior pharynx with cobblestoning.  Minimal cervical lymphadenopathy present.  Normal tympanic membranes with mildly erythematous ear canal in the right normal left. Lungs: Normal work of breathing. CTABL Heart: RRR no MRG Abd: NABS, Soft. Nondistended, Nontender Exts: Brisk capillary refill, warm and well perfused.   Lab and Radiology Results No results found for this or any previous visit (from the past 72 hour(s)). Dg Chest 2 View  Result Date: 09/28/2017 CLINICAL DATA:  History of persistent cough. EXAM: CHEST - 2 VIEW COMPARISON:  Chest x-ray 07/04/2012. FINDINGS: Mediastinum hilar structures normal. Lungs are clear. No pleural effusion or pneumothorax. Heart size normal. No acute bony abnormality. Degenerative changes thoracic spine. Old left rib fractures. IMPRESSION: No acute cardiopulmonary disease. Electronically Signed   By: Maisie Fushomas  Register   On: 09/28/2017 10:00   I personally (independently) visualized and performed the interpretation  of the images attached in this note.   Assessment and Plan: 37 y.o. male with  Chronic cough etiology is unclear however it is most likely postnasal drainage.  We will plan to treat for postnasal drainage with cetirizine and Flonase nasal spray.  Additionally will treat for other common causes of chronic cough including cough variant asthma with a trial of albuterol and silent acid reflux with trial of omeprazole.  If not better recheck in 1 month.  If patient explained benefits we discussed how to taper off medications individually to find the one that is making the biggest difference.  Recheck as needed.   Gout therapy: Patient is completely intolerant to allopurinol and Uloric may become very expensive.  Probenecid may be an option.  Orders Placed This Encounter  Procedures  . DG Chest 2 View    Order Specific Question:   Reason for exam:    Answer:   Cough, assess intra-thoracic pathology    Order Specific Question:   Preferred imaging location?    [facility]   Meds ordered this encounter  Medications  . fluticasone (FLONASE) 50 MCG/ACT nasal spray    Sig: Place 2 sprays into both nostrils daily.    Dispense:  16 g    Refill:  2  . omeprazole (PRILOSEC) 40 MG capsule    Sig: Take 1 capsule (40 mg total) by mouth daily.    Dispense:  30 capsule    Refill:  3  . albuterol (PROVENTIL HFA;VENTOLIN HFA) 108 (90 Base) MCG/ACT inhaler    Sig: Inhale 2 puffs into the lungs every 6 (six) hours as needed for wheezing or  shortness of breath.    Dispense:  1 Inhaler    Refill:  0  . scopolamine (TRANSDERM-SCOP, 1.5 MG,) 1 MG/3DAYS    Sig: Place 1 patch (1.5 mg total) onto the skin every 3 (three) days.    Dispense:  4 patch    Refill:  3     Historical information moved to improve visibility of documentation.  Past Medical History:  Diagnosis Date  . Hx of appendectomy    Past Surgical History:  Procedure Laterality Date  . APPENDECTOMY     Social History    Tobacco Use  . Smoking status: Never Smoker  . Smokeless tobacco: Never Used  Substance Use Topics  . Alcohol use: No   family history includes Diabetes in his paternal grandfather; Healthy in his father; Leukemia in his mother.  Medications: Current Outpatient Medications  Medication Sig Dispense Refill  . Febuxostat (ULORIC) 80 MG TABS Take by mouth.    Marland Kitchen ibuprofen (ADVIL,MOTRIN) 600 MG tablet Take by mouth.    Marland Kitchen albuterol (PROVENTIL HFA;VENTOLIN HFA) 108 (90 Base) MCG/ACT inhaler Inhale 2 puffs into the lungs every 6 (six) hours as needed for wheezing or shortness of breath. 1 Inhaler 0  . fluticasone (FLONASE) 50 MCG/ACT nasal spray Place 2 sprays into both nostrils daily. 16 g 2  . omeprazole (PRILOSEC) 40 MG capsule Take 1 capsule (40 mg total) by mouth daily. 30 capsule 3  . scopolamine (TRANSDERM-SCOP, 1.5 MG,) 1 MG/3DAYS Place 1 patch (1.5 mg total) onto the skin every 3 (three) days. 4 patch 3   No current facility-administered medications for this visit.    Allergies  Allergen Reactions  . Allopurinol Rash    Health Maintenance Health Maintenance  Topic Date Due  . INFLUENZA VACCINE  11/23/2017 (Originally 11/08/2017)  . TETANUS/TDAP  01/01/2022  . HIV Screening  Completed    Discussed warning signs or symptoms. Please see discharge instructions. Patient expresses understanding.

## 2017-09-28 NOTE — Patient Instructions (Signed)
Thank you for coming in today.  Use the albuterol to see if it works.  Use the Flonase nasal spray and over the counter zyrtec or claritin for allergies.  Take ompreazole daily for possible silent acid reflux.   Get xray today.   If still coughing in 1 month let me know.    Cough, Adult Coughing is a reflex that clears your throat and your airways. Coughing helps to heal and protect your lungs. It is normal to cough occasionally, but a cough that happens with other symptoms or lasts a long time may be a sign of a condition that needs treatment. A cough may last only 2-3 weeks (acute), or it may last longer than 8 weeks (chronic). What are the causes? Coughing is commonly caused by:  Breathing in substances that irritate your lungs.  A viral or bacterial respiratory infection.  Allergies.  Asthma.  Postnasal drip.  Smoking.  Acid backing up from the stomach into the esophagus (gastroesophageal reflux).  Certain medicines.  Chronic lung problems, including COPD (or rarely, lung cancer).  Other medical conditions such as heart failure.  Follow these instructions at home: Pay attention to any changes in your symptoms. Take these actions to help with your discomfort:  Take medicines only as told by your health care provider. ? If you were prescribed an antibiotic medicine, take it as told by your health care provider. Do not stop taking the antibiotic even if you start to feel better. ? Talk with your health care provider before you take a cough suppressant medicine.  Drink enough fluid to keep your urine clear or pale yellow.  If the air is dry, use a cold steam vaporizer or humidifier in your bedroom or your home to help loosen secretions.  Avoid anything that causes you to cough at work or at home.  If your cough is worse at night, try sleeping in a semi-upright position.  Avoid cigarette smoke. If you smoke, quit smoking. If you need help quitting, ask your health  care provider.  Avoid caffeine.  Avoid alcohol.  Rest as needed.  Contact a health care provider if:  You have new symptoms.  You cough up pus.  Your cough does not get better after 2-3 weeks, or your cough gets worse.  You cannot control your cough with suppressant medicines and you are losing sleep.  You develop pain that is getting worse or pain that is not controlled with pain medicines.  You have a fever.  You have unexplained weight loss.  You have night sweats. Get help right away if:  You cough up blood.  You have difficulty breathing.  Your heartbeat is very fast. This information is not intended to replace advice given to you by your health care provider. Make sure you discuss any questions you have with your health care provider. Document Released: 09/23/2010 Document Revised: 09/02/2015 Document Reviewed: 06/03/2014 Elsevier Interactive Patient Education  [employer]2018 [employer] Inc[employer]

## 2017-11-08 ENCOUNTER — Other Ambulatory Visit: Payer: Self-pay

## 2017-11-08 ENCOUNTER — Emergency Department (INDEPENDENT_AMBULATORY_CARE_PROVIDER_SITE_OTHER): Payer: BLUE CROSS/BLUE SHIELD

## 2017-11-08 ENCOUNTER — Emergency Department (INDEPENDENT_AMBULATORY_CARE_PROVIDER_SITE_OTHER)
Admission: EM | Admit: 2017-11-08 | Discharge: 2017-11-08 | Disposition: A | Discharge status: Home / Self Care | Payer: BLUE CROSS/BLUE SHIELD | Source: Home / Self Care | Attending: Family Medicine | Admitting: Family Medicine

## 2017-11-08 DIAGNOSIS — R0781 Pleurodynia: Secondary | ICD-10-CM

## 2017-11-08 DIAGNOSIS — R0789 Other chest pain: Secondary | ICD-10-CM

## 2017-11-08 MED ORDER — METHOCARBAMOL 500 MG PO TABS: 500.0000 mg | ORAL_TABLET | Freq: Two times a day (BID) | ORAL | 0 refills | Status: DC

## 2017-11-08 MED ORDER — TRAMADOL HCL 50 MG PO TABS: 50.0000 mg | ORAL_TABLET | Freq: Four times a day (QID) | ORAL | 0 refills | Status: DC | PRN

## 2017-11-08 MED ORDER — IBUPROFEN 600 MG PO TABS
600.0000 mg | ORAL_TABLET | Freq: Once | ORAL | Status: AC
Start: 2017-11-08 — End: 2017-11-08
  Administered 2017-11-08: 600 mg via ORAL

## 2017-11-08 MED ORDER — IBUPROFEN 600 MG PO TABS: 600.0000 mg | ORAL_TABLET | Freq: Four times a day (QID) | ORAL | 0 refills | Status: DC | PRN

## 2017-11-08 NOTE — ED Provider Notes (Addendum)
[facility]    CSN: [id number] Arrival date & time: 11/08/17  1827     History   Chief Complaint Chief Complaint  Patient presents with  . Abdominal Pain    HPI Connor Jefferson is a 37 y.o. male.   HPI Connor Jefferson is a 37 y.o. male presenting to UC with c/o suddenly worsening Right sided chest pain. He has had a mild pain in his RUQ wrapping around to his Right flank for 4 days but today he felt a sudden onset sharp severe pain in the same area. Pain is worse with certain movements and palpation.  Denies known injury but he is an [employer] and notes he may have bumped it against something or pulled a muscle but does not recall. Denies cough or congestion. No rash or bruising where pain is. No fever, chills, n/v/d. Denies abdominal pain. Pain feels like it is over his ribs. No hx of kidney stones.   Past Medical History:  Diagnosis Date  . Hx of appendectomy     Patient Active Problem List   Diagnosis Date Noted  . Thyromegaly 08/13/2017  . Diarrhea 06/13/2017  . 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 Right knee pain 12/20/2015  . Gout 03/24/2013  . Hematospermia 01/07/2012    Past Surgical History:  Procedure Laterality Date  . APPENDECTOMY         Home Medications    Prior to Admission medications   Medication Sig Start Date End Date Taking? Authorizing Provider  albuterol (PROVENTIL HFA;VENTOLIN HFA) 108 (90 Base) MCG/ACT inhaler Inhale 2 puffs into the lungs every 6 (six) hours as needed for wheezing or shortness of breath. 09/28/17   Rodolph Bong, MD  Febuxostat (ULORIC) 80 MG TABS Take by mouth. 04/27/15   [provider]  fluticasone (FLONASE) 50 MCG/ACT nasal spray Place 2 sprays into both nostrils daily. 09/28/17   Rodolph Bong, MD  ibuprofen (ADVIL,MOTRIN) 600 MG tablet Take 1 tablet (600 mg total) by mouth every 6 (six) hours as needed. 11/08/17   Lurene Shadow, PA-C  methocarbamol (ROBAXIN) 500 MG tablet  Take 1 tablet (500 mg total) by mouth 2 (two) times daily. 11/08/17   Lurene Shadow, PA-C  omeprazole (PRILOSEC) 40 MG capsule Take 1 capsule (40 mg total) by mouth daily. 09/28/17   Rodolph Bong, MD  scopolamine (TRANSDERM-SCOP, 1.5 MG,) 1 MG/3DAYS Place 1 patch (1.5 mg total) onto the skin every 3 (three) days. 09/28/17   Rodolph Bong, MD  traMADol (ULTRAM) 50 MG tablet Take 1 tablet (50 mg total) by mouth every 6 (six) hours as needed. 11/08/17   Lurene Shadow, PA-C    Family History Family History  Problem Relation Age of Onset  . Diabetes Paternal Grandfather   . Leukemia Mother        deceased  . Healthy Father     Social History Social History   Tobacco Use  . Smoking status: Never Smoker  . Smokeless tobacco: Never Used  Substance Use Topics  . Alcohol use: No  . Drug use: No     Allergies   Allopurinol   Review of Systems Review of Systems  Constitutional: Negative for chills, diaphoresis, fatigue and fever.  Respiratory: Negative for chest tightness and shortness of breath.   Cardiovascular: Positive for chest pain (Right side chest wall). Negative for palpitations.  Gastrointestinal: Negative for abdominal pain, diarrhea, nausea and vomiting.  Genitourinary: Positive for flank  pain (Right). Negative for decreased urine volume, dysuria, frequency and hematuria.  Musculoskeletal: Positive for back pain and myalgias.  Skin: Negative for color change and rash.     Physical Exam Triage Vital Signs ED Triage Vitals  Enc Vitals Group     BP 11/08/17 1858 130/86     Pulse Rate 11/08/17 1858 74     Resp --      Temp 11/08/17 1858 98 F (36.7 C)     Temp Source 11/08/17 1858 Oral     SpO2 11/08/17 1858 98 %     Weight 11/08/17 1901 242 lb (109.8 kg)     Height 11/08/17 1901 5\' 8"  (1.727 m)     Head Circumference --      Peak Flow --      Pain Score 11/08/17 1859 7     Pain Loc --      Pain Edu? --      Excl. in GC? --    No data found.  Updated Vital  Signs BP 130/86 (BP Location: Right Arm)   Pulse 74   Temp 98 F (36.7 C) (Oral)   Ht 5\' 8"  (1.727 m)   Wt 242 lb (109.8 kg)   SpO2 98%   BMI 36.80 kg/m   Visual Acuity Right Eye Distance:   Left Eye Distance:   Bilateral Distance:    Right Eye Near:   Left Eye Near:    Bilateral Near:     Physical Exam  Constitutional: He is oriented to person, place, and time. He appears well-developed and well-nourished.  Non-toxic appearance. He does not appear ill. No distress.  HENT:  Head: Normocephalic and atraumatic.  Eyes: EOM are normal.  Neck: Normal range of motion.  Cardiovascular: Normal rate and regular rhythm.  Pulmonary/Chest: Effort normal and breath sounds normal.     He exhibits tenderness.    Abdominal: Normal appearance. There is no tenderness. There is no CVA tenderness.  Musculoskeletal: Normal range of motion.  Neurological: He is alert and oriented to person, place, and time.  Skin: Skin is warm and dry.  Psychiatric: He has a normal mood and affect. His behavior is normal.  Nursing note and vitals reviewed.    UC Treatments / Results  Labs (all labs ordered are listed, but only abnormal results are displayed) Labs Reviewed - No data to display  EKG None  Radiology Dg Ribs Unilateral W/chest Right  Result Date: 11/08/2017 CLINICAL DATA:  37 y/o  M; right lower rib pain for 3 days. EXAM: RIGHT RIBS AND CHEST - 3+ VIEW COMPARISON:  None. FINDINGS: No fracture or other bone lesions are seen involving the ribs. There is no evidence of pneumothorax or pleural effusion. Both lungs are clear. Heart size and mediastinal contours are within normal limits. IMPRESSION: Negative. Electronically Signed   By: Mitzi Hansen M.D.   On: 11/08/2017 19:45    Procedures Procedures (including critical care time)  Medications Ordered in UC Medications  ibuprofen (ADVIL,MOTRIN) tablet 600 mg (600 mg Oral Given 11/08/17 1929)    Initial Impression /  Assessment and Plan / UC Course  I have reviewed the triage vital signs and the nursing notes.  Pertinent labs & imaging results that were available during my care of the patient were reviewed by me and considered in my medical decision making (see chart for details).    Reassured pt of normal CXR Pain appears musculoskeletal. Slight possibility early shingles. Advised pt to monitor for rash.  Encouraged symptomatic treatment.  Final Clinical Impressions(s) / UC Diagnoses   Final diagnoses:  Right-sided chest wall pain     Discharge Instructions      Tramadol is strong pain medication. While taking, do not drink alcohol, drive, or perform any other activities that requires focus while taking these medications.   Robaxin (methocarbamol) is a muscle relaxer and may cause drowsiness. Do not drink alcohol, drive, or operate heavy machinery while taking.  Please follow up with Dr. Denyse Amassorey next week if not improving. You may want to call tomorrow to schedule an appointment for next week because you can cancel if you decide you no longer need it.     ED Prescriptions    Medication Sig Dispense Auth. Provider   ibuprofen (ADVIL,MOTRIN) 600 MG tablet Take 1 tablet (600 mg total) by mouth every 6 (six) hours as needed. 30 tablet Doroteo GlassmanPhelps, Hether Anselmo O, PA-C   traMADol (ULTRAM) 50 MG tablet Take 1 tablet (50 mg total) by mouth every 6 (six) hours as needed. 15 tablet Doroteo GlassmanPhelps, Benen Weida O, PA-C   methocarbamol (ROBAXIN) 500 MG tablet Take 1 tablet (500 mg total) by mouth 2 (two) times daily. 20 tablet Lurene ShadowPhelps, Loreal Schuessler O, PA-C     Controlled Substance Prescriptions [place] Controlled Substance Registry consulted? Yes, I have consulted the [place] Controlled Substances Registry for this patient, and feel the risk/benefit ratio today is favorable for proceeding with this prescription for a controlled substance.   Lurene Shadowhelps, Nimrit Kehres O, PA-C 11/09/17 0820    Lurene ShadowPhelps, Daviana Haymaker O, PA-C 11/09/17 [phone]

## 2017-11-08 NOTE — Discharge Instructions (Signed)
°  Tramadol is strong pain medication. While taking, do not drink alcohol, drive, or perform any other activities that requires focus while taking these medications.   Robaxin (methocarbamol) is a muscle relaxer and may cause drowsiness. Do not drink alcohol, drive, or operate heavy machinery while taking.  Please follow up with Dr. Denyse Amassorey next week if not improving. You may want to call tomorrow to schedule an appointment for next week because you can cancel if you decide you no longer need it.

## 2017-11-08 NOTE — ED Triage Notes (Signed)
Pt has had minor pain in the upper right quad since Monday.  States that it felt like muscle soreness.  Today the pain has increased to a sharp pain and "tingling".

## 2017-11-12 ENCOUNTER — Telehealth: Payer: Self-pay | Admitting: Emergency Medicine

## 2017-11-12 MED ORDER — VALACYCLOVIR HCL 1 G PO TABS: 1000.0000 mg | ORAL_TABLET | Freq: Three times a day (TID) | ORAL | 0 refills | Status: DC

## 2017-11-12 NOTE — Telephone Encounter (Signed)
Pt called to state he developed a rash yesterday after presenting last week with severe Right sided/flank pain w/o known cause. Hx c/w shingles (which was discussed during last visit) will send in valtrex. Pt to f/u with PCP as needed.

## 2018-03-11 DIAGNOSIS — M546 Pain in thoracic spine: Secondary | ICD-10-CM | POA: Diagnosis not present

## 2018-03-11 DIAGNOSIS — M2560 Stiffness of unspecified joint, not elsewhere classified: Secondary | ICD-10-CM | POA: Diagnosis not present

## 2018-03-11 DIAGNOSIS — R293 Abnormal posture: Secondary | ICD-10-CM | POA: Diagnosis not present

## 2018-03-11 DIAGNOSIS — M9902 Segmental and somatic dysfunction of thoracic region: Secondary | ICD-10-CM | POA: Diagnosis not present

## 2018-03-12 DIAGNOSIS — M1A09X1 Idiopathic chronic gout, multiple sites, with tophus (tophi): Secondary | ICD-10-CM | POA: Diagnosis not present

## 2018-04-01 DIAGNOSIS — M2560 Stiffness of unspecified joint, not elsewhere classified: Secondary | ICD-10-CM | POA: Diagnosis not present

## 2018-04-01 DIAGNOSIS — R293 Abnormal posture: Secondary | ICD-10-CM | POA: Diagnosis not present

## 2018-04-01 DIAGNOSIS — M9902 Segmental and somatic dysfunction of thoracic region: Secondary | ICD-10-CM | POA: Diagnosis not present

## 2018-04-01 DIAGNOSIS — M546 Pain in thoracic spine: Secondary | ICD-10-CM | POA: Diagnosis not present

## 2018-04-08 DIAGNOSIS — M546 Pain in thoracic spine: Secondary | ICD-10-CM | POA: Diagnosis not present

## 2018-04-08 DIAGNOSIS — M2560 Stiffness of unspecified joint, not elsewhere classified: Secondary | ICD-10-CM | POA: Diagnosis not present

## 2018-04-08 DIAGNOSIS — M9902 Segmental and somatic dysfunction of thoracic region: Secondary | ICD-10-CM | POA: Diagnosis not present

## 2018-04-08 DIAGNOSIS — R293 Abnormal posture: Secondary | ICD-10-CM | POA: Diagnosis not present

## 2018-04-09 DIAGNOSIS — R293 Abnormal posture: Secondary | ICD-10-CM | POA: Diagnosis not present

## 2018-04-09 DIAGNOSIS — M2560 Stiffness of unspecified joint, not elsewhere classified: Secondary | ICD-10-CM | POA: Diagnosis not present

## 2018-04-09 DIAGNOSIS — M9902 Segmental and somatic dysfunction of thoracic region: Secondary | ICD-10-CM | POA: Diagnosis not present

## 2018-04-09 DIAGNOSIS — M546 Pain in thoracic spine: Secondary | ICD-10-CM | POA: Diagnosis not present

## 2018-05-02 DIAGNOSIS — M546 Pain in thoracic spine: Secondary | ICD-10-CM | POA: Diagnosis not present

## 2018-05-02 DIAGNOSIS — R293 Abnormal posture: Secondary | ICD-10-CM | POA: Diagnosis not present

## 2018-05-02 DIAGNOSIS — M9902 Segmental and somatic dysfunction of thoracic region: Secondary | ICD-10-CM | POA: Diagnosis not present

## 2018-05-02 DIAGNOSIS — M2560 Stiffness of unspecified joint, not elsewhere classified: Secondary | ICD-10-CM | POA: Diagnosis not present

## 2018-05-23 DIAGNOSIS — M9902 Segmental and somatic dysfunction of thoracic region: Secondary | ICD-10-CM | POA: Diagnosis not present

## 2018-05-23 DIAGNOSIS — M2560 Stiffness of unspecified joint, not elsewhere classified: Secondary | ICD-10-CM | POA: Diagnosis not present

## 2018-05-23 DIAGNOSIS — M546 Pain in thoracic spine: Secondary | ICD-10-CM | POA: Diagnosis not present

## 2018-05-23 DIAGNOSIS — R293 Abnormal posture: Secondary | ICD-10-CM | POA: Diagnosis not present

## 2018-05-24 ENCOUNTER — Encounter: Payer: Self-pay | Admitting: Family Medicine

## 2018-05-24 DIAGNOSIS — M2560 Stiffness of unspecified joint, not elsewhere classified: Secondary | ICD-10-CM | POA: Diagnosis not present

## 2018-05-24 DIAGNOSIS — M9902 Segmental and somatic dysfunction of thoracic region: Secondary | ICD-10-CM | POA: Diagnosis not present

## 2018-05-24 DIAGNOSIS — M546 Pain in thoracic spine: Secondary | ICD-10-CM | POA: Diagnosis not present

## 2018-05-24 DIAGNOSIS — R293 Abnormal posture: Secondary | ICD-10-CM | POA: Diagnosis not present

## 2018-05-30 DIAGNOSIS — R293 Abnormal posture: Secondary | ICD-10-CM | POA: Diagnosis not present

## 2018-05-30 DIAGNOSIS — M2560 Stiffness of unspecified joint, not elsewhere classified: Secondary | ICD-10-CM | POA: Diagnosis not present

## 2018-05-30 DIAGNOSIS — M9902 Segmental and somatic dysfunction of thoracic region: Secondary | ICD-10-CM | POA: Diagnosis not present

## 2018-05-30 DIAGNOSIS — M546 Pain in thoracic spine: Secondary | ICD-10-CM | POA: Diagnosis not present

## 2018-06-06 DIAGNOSIS — M546 Pain in thoracic spine: Secondary | ICD-10-CM | POA: Diagnosis not present

## 2018-06-06 DIAGNOSIS — M9902 Segmental and somatic dysfunction of thoracic region: Secondary | ICD-10-CM | POA: Diagnosis not present

## 2018-06-06 DIAGNOSIS — R293 Abnormal posture: Secondary | ICD-10-CM | POA: Diagnosis not present

## 2018-06-06 DIAGNOSIS — M2560 Stiffness of unspecified joint, not elsewhere classified: Secondary | ICD-10-CM | POA: Diagnosis not present

## 2018-06-07 DIAGNOSIS — M2560 Stiffness of unspecified joint, not elsewhere classified: Secondary | ICD-10-CM | POA: Diagnosis not present

## 2018-06-07 DIAGNOSIS — M9902 Segmental and somatic dysfunction of thoracic region: Secondary | ICD-10-CM | POA: Diagnosis not present

## 2018-06-07 DIAGNOSIS — R293 Abnormal posture: Secondary | ICD-10-CM | POA: Diagnosis not present

## 2018-06-07 DIAGNOSIS — M546 Pain in thoracic spine: Secondary | ICD-10-CM | POA: Diagnosis not present

## 2018-07-12 DIAGNOSIS — Z008 Encounter for other general examination: Secondary | ICD-10-CM | POA: Diagnosis not present

## 2018-11-28 ENCOUNTER — Encounter: Payer: Self-pay | Admitting: Family Medicine

## 2018-11-28 ENCOUNTER — Ambulatory Visit (INDEPENDENT_AMBULATORY_CARE_PROVIDER_SITE_OTHER): Payer: BC Managed Care – PPO | Admitting: Family Medicine

## 2018-11-28 ENCOUNTER — Other Ambulatory Visit: Payer: Self-pay

## 2018-11-28 VITALS — Ht 69.0 in | Wt 228.0 lb

## 2018-11-28 DIAGNOSIS — Z6833 Body mass index (BMI) 33.0-33.9, adult: Secondary | ICD-10-CM

## 2018-11-28 DIAGNOSIS — E010 Iodine-deficiency related diffuse (endemic) goiter: Secondary | ICD-10-CM

## 2018-11-28 DIAGNOSIS — R197 Diarrhea, unspecified: Secondary | ICD-10-CM

## 2018-11-28 DIAGNOSIS — M1A0790 Idiopathic chronic gout, unspecified ankle and foot, without tophus (tophi): Secondary | ICD-10-CM

## 2018-11-28 DIAGNOSIS — Z5181 Encounter for therapeutic drug level monitoring: Secondary | ICD-10-CM

## 2018-11-28 NOTE — Progress Notes (Signed)
Virtual Visit  via Video Note  I connected with      Connor Jefferson by a video enabled telemedicine application and verified that I am speaking with the correct person using two identifiers.   I discussed the limitations of evaluation and management by telemedicine and the availability of in person appointments. The patient expressed understanding and agreed to proceed.  History of Present Illness: Connor Jefferson is a 38 y.o. male who would like to discuss diarrhea.  Patient has had chronic diarrhea ongoing now for over a year.  He was seen in March 2018 for diarrhea associated with some cramping and pain.  Stool culture was negative but lactoferrin was positive.  He was somewhat lost to follow-up since.  Since then he has had diarrhea nearly every day.  He will have 3 loose stools a day.  Sometimes he has some cramping but denies significant pain.  No fevers or chills vomiting or blood in the stool.  He has lost weight intentionally about 20 pounds over the last few months.  He notes he is tried eliminating dairy which did not help.  Additionally he tried a ketogenic diet which did not make a difference for his bowel movements.  He is not sure if he is tried a gluten-free diet with his ketogenic diet.   Past surgical history significant for appendectomy.  Observations/Objective: Ht 5\' 9"  (1.753 m)   Wt 228 lb (103.4 kg)   BMI 33.67 kg/m  Wt Readings from Last 5 Encounters:  11/28/18 228 lb (103.4 kg)  11/08/17 242 lb (109.8 kg)  09/28/17 238 lb (108 kg)  08/17/17 240 lb (108.9 kg)  08/13/17 237 lb (107.5 kg)   Exam: Appearance nontoxic Normal Speech.     Assessment and Plan: 38 y.o. male with  Diarrhea.  Patient has chronic diarrhea ongoing for over a year now.  He has had some abnormal stool studies in the past but no clear definitive diagnosis.  I think at this point it is worthwhile proceeding with further work-up.  We will repeat stool studies to soon stool culture, ova  parasite exam, and lactoferrin.  Additionally will do a lab work-up including CBC metabolic panel TSH and lipase.  Additionally patient has chronic gout and is taking allopurinol.  Will check uric acid as well.  Also check lipid panel given history of obesity.  If all is well with stool studies next up would likely be referral to gastroenterology for further evaluation possibly colonoscopy.  Additionally patient is scheduled for a well adult visit in October.  Will likely follow these issues up then.  PDMP not reviewed this encounter. Orders Placed This Encounter  Procedures  . Stool culture  . Ova and Parasite Examination  . CBC with Differential/Platelet  . COMPLETE METABOLIC PANEL WITH GFR  . Lipid Panel w/reflex Direct LDL  . TSH  . Uric acid  . Lipase  . Stool, WBC/Lactoferrin   No orders of the defined types were placed in this encounter.   Follow Up Instructions:    I discussed the assessment and treatment plan with the patient. The patient was provided an opportunity to ask questions and all were answered. The patient agreed with the plan and demonstrated an understanding of the instructions.   The patient was advised to call back or seek an in-person evaluation if the symptoms worsen or if the condition fails to improve as anticipated.  Time: 15 minutes of intraservice time, with >22 minutes of total  time during today's visit.      Historical information moved to improve visibility of documentation.  Past Medical History:  Diagnosis Date  . Hx of appendectomy    Past Surgical History:  Procedure Laterality Date  . APPENDECTOMY     Social History   Tobacco Use  . Smoking status: Never Smoker  . Smokeless tobacco: Never Used  Substance Use Topics  . Alcohol use: No   family history includes Diabetes in his paternal grandfather; Healthy in his father; Leukemia in his mother.  Medications: Current Outpatient Medications  Medication Sig Dispense Refill  .  Febuxostat (ULORIC) 80 MG TABS Take by mouth.    Marland Kitchen albuterol (PROVENTIL HFA;VENTOLIN HFA) 108 (90 Base) MCG/ACT inhaler Inhale 2 puffs into the lungs every 6 (six) hours as needed for wheezing or shortness of breath. (Patient not taking: Reported on 11/28/2018) 1 Inhaler 0  . fluticasone (FLONASE) 50 MCG/ACT nasal spray Place 2 sprays into both nostrils daily. (Patient not taking: Reported on 11/28/2018) 16 g 2  . ibuprofen (ADVIL,MOTRIN) 600 MG tablet Take 1 tablet (600 mg total) by mouth every 6 (six) hours as needed. (Patient not taking: Reported on 11/28/2018) 30 tablet 0  . methocarbamol (ROBAXIN) 500 MG tablet Take 1 tablet (500 mg total) by mouth 2 (two) times daily. (Patient not taking: Reported on 11/28/2018) 20 tablet 0  . omeprazole (PRILOSEC) 40 MG capsule Take 1 capsule (40 mg total) by mouth daily. (Patient not taking: Reported on 11/28/2018) 30 capsule 3  . scopolamine (TRANSDERM-SCOP, 1.5 MG,) 1 MG/3DAYS Place 1 patch (1.5 mg total) onto the skin every 3 (three) days. (Patient not taking: Reported on 11/28/2018) 4 patch 3  . traMADol (ULTRAM) 50 MG tablet Take 1 tablet (50 mg total) by mouth every 6 (six) hours as needed. (Patient not taking: Reported on 11/28/2018) 15 tablet 0  . valACYclovir (VALTREX) 1000 MG tablet Take 1 tablet (1,000 mg total) by mouth 3 (three) times daily. (Patient not taking: Reported on 11/28/2018) 21 tablet 0   No current facility-administered medications for this visit.    Allergies  Allergen Reactions  . Allopurinol Rash

## 2018-11-29 LAB — COMPLETE METABOLIC PANEL WITH GFR
AG Ratio: 1.6 (calc) (ref 1.0–2.5)
ALT: 41 U/L (ref 9–46)
AST: 22 U/L (ref 10–40)
Albumin: 4.8 g/dL (ref 3.6–5.1)
Alkaline phosphatase (APISO): 65 U/L (ref 36–130)
BUN: 12 mg/dL (ref 7–25)
CO2: 28 mmol/L (ref 20–32)
Calcium: 9.9 mg/dL (ref 8.6–10.3)
Chloride: 102 mmol/L (ref 98–110)
Creat: 0.96 mg/dL (ref 0.60–1.35)
GFR, Est African American: 117 mL/min/{1.73_m2} (ref >=60)
GFR, Est Non African American: 101 mL/min/{1.73_m2} (ref >=60)
Globulin: 3 g/dL (calc) (ref 1.9–3.7)
Glucose, Bld: 88 mg/dL (ref 65–99)
Potassium: 4 mmol/L (ref 3.5–5.3)
Sodium: 140 mmol/L (ref 135–146)
Total Bilirubin: 0.5 mg/dL (ref 0.2–1.2)
Total Protein: 7.8 g/dL (ref 6.1–8.1)

## 2018-11-29 LAB — CBC WITH DIFFERENTIAL/PLATELET
Absolute Monocytes: 518 cells/uL (ref 200–950)
Basophils Absolute: 22 cells/uL (ref 0–200)
Basophils Relative: 0.3 %
Eosinophils Absolute: 118 cells/uL (ref 15–500)
Eosinophils Relative: 1.6 %
HCT: 46.8 % (ref 38.5–50.0)
Hemoglobin: 16 g/dL (ref 13.2–17.1)
Lymphs Abs: 2035 cells/uL (ref 850–3900)
MCH: 30.3 pg (ref 27.0–33.0)
MCHC: 34.2 g/dL (ref 32.0–36.0)
MCV: 88.6 fL (ref 80.0–100.0)
MPV: 11.5 fL (ref 7.5–12.5)
Monocytes Relative: 7 %
Neutro Abs: 4706 cells/uL (ref 1500–7800)
Neutrophils Relative %: 63.6 %
Platelets: 225 10*3/uL (ref 140–400)
RBC: 5.28 10*6/uL (ref 4.20–5.80)
RDW: 13 % (ref 11.0–15.0)
Total Lymphocyte: 27.5 %
WBC: 7.4 10*3/uL (ref 3.8–10.8)

## 2018-11-29 LAB — TSH: TSH: 1.72 mIU/L (ref 0.40–4.50)

## 2018-11-29 LAB — LIPID PANEL W/REFLEX DIRECT LDL
Cholesterol: 180 mg/dL (ref <200)
HDL: 46 mg/dL (ref >=40)
LDL Cholesterol (Calc): 109 mg/dL (calc) — ABNORMAL HIGH
Non-HDL Cholesterol (Calc): 134 mg/dL (calc) — ABNORMAL HIGH (ref <130)
Total CHOL/HDL Ratio: 3.9 (calc) (ref <5.0)
Triglycerides: 136 mg/dL (ref <150)

## 2018-11-29 LAB — LIPASE: Lipase: 14 U/L (ref 7–60)

## 2018-11-29 LAB — URIC ACID: Uric Acid, Serum: 6.9 mg/dL (ref 4.0–8.0)

## 2018-12-02 ENCOUNTER — Encounter: Payer: Self-pay | Admitting: Gastroenterology

## 2018-12-02 LAB — OVA AND PARASITE EXAMINATION
CONCENTRATE RESULT:: NONE SEEN
MICRO NUMBER:: 794473
SPECIMEN QUALITY:: ADEQUATE
TRICHROME RESULT:: NONE SEEN

## 2018-12-02 LAB — STOOL CULTURE
MICRO NUMBER:: 795146
MICRO NUMBER:: 795147
MICRO NUMBER:: 795148
SHIGA RESULT:: NOT DETECTED
SPECIMEN QUALITY:: ADEQUATE
SPECIMEN QUALITY:: ADEQUATE
SPECIMEN QUALITY:: ADEQUATE

## 2018-12-02 LAB — FECAL LACTOFERRIN, QUANT
Fecal Lactoferrin: NEGATIVE
MICRO NUMBER:: 794739
SPECIMEN QUALITY:: ADEQUATE

## 2018-12-02 NOTE — Addendum Note (Signed)
Addended by: Gregor Hams on: 12/02/2018 07:01 AM   Modules accepted: Orders

## 2018-12-10 DIAGNOSIS — U071 COVID-19: Secondary | ICD-10-CM | POA: Diagnosis not present

## 2018-12-10 DIAGNOSIS — R509 Fever, unspecified: Secondary | ICD-10-CM | POA: Diagnosis not present

## 2018-12-10 DIAGNOSIS — R5383 Other fatigue: Secondary | ICD-10-CM | POA: Diagnosis not present

## 2018-12-10 DIAGNOSIS — J029 Acute pharyngitis, unspecified: Secondary | ICD-10-CM | POA: Diagnosis not present

## 2018-12-11 ENCOUNTER — Encounter: Payer: Self-pay | Admitting: Family Medicine

## 2018-12-23 ENCOUNTER — Ambulatory Visit: Payer: Self-pay | Admitting: Gastroenterology

## 2019-01-14 ENCOUNTER — Encounter: Payer: Self-pay | Admitting: Family Medicine

## 2019-01-14 ENCOUNTER — Encounter: Payer: BLUE CROSS/BLUE SHIELD | Admitting: Family Medicine

## 2019-01-14 ENCOUNTER — Other Ambulatory Visit: Payer: Self-pay

## 2019-01-14 ENCOUNTER — Ambulatory Visit: Payer: BC Managed Care – PPO | Admitting: Family Medicine

## 2019-01-14 VITALS — BP 120/83 | HR 81 | Wt 230.0 lb

## 2019-01-14 DIAGNOSIS — Z0000 Encounter for general adult medical examination without abnormal findings: Secondary | ICD-10-CM | POA: Diagnosis not present

## 2019-01-14 DIAGNOSIS — M7062 Trochanteric bursitis, left hip: Secondary | ICD-10-CM

## 2019-01-14 DIAGNOSIS — Z6833 Body mass index (BMI) 33.0-33.9, adult: Secondary | ICD-10-CM | POA: Diagnosis not present

## 2019-01-14 NOTE — Patient Instructions (Signed)
Thank you for coming in today.  Work on core strength for your back.  Also work on hip abductor strength using side leg raises.   If not improved let me know and I will arrange for PT  For ongoing medical care Dr Zigmund Daniel is a great fit.

## 2019-01-14 NOTE — Progress Notes (Signed)
Connor GainSamuel S Jefferson is a 38 y.o. male who [name] today for well adult visit.   Connor Jefferson is doing reasonably well.  He notes that he was seen in August for diarrhea and blood in the stool.  He was eventually referred to gastroenterology but the appointment had to be delayed due to a concern for possible COVID-19.  Fortunately COVID-19 was negative and patient has follow-up appointment scheduled with gastroenterology later this month.  He still is having some occasional diarrhea and has some occasional blood in the stool.  He denies significant abdominal pain.  Additionally he has occasional back pain with activity and some occasional left lateral hip pain with activity.  He denies any injury.  Has not had any treatment for this yet.   He exercises regularly and tries eat a healthy diet to control his weight.  .   ROS as above:  Past Medical History:  Diagnosis Date  . Hx of appendectomy    Past Surgical History:  Procedure Laterality Date  . APPENDECTOMY     Social History   Tobacco Use  . Smoking status: Never Smoker  . Smokeless tobacco: Never Used  Substance Use Topics  . Alcohol use: No   family history includes Diabetes in his paternal grandfather; Healthy in his father; Leukemia in his mother.  Medications: Current Outpatient Medications  Medication Sig Dispense Refill  . albuterol (PROVENTIL HFA;VENTOLIN HFA) 108 (90 Base) MCG/ACT inhaler Inhale 2 puffs into the lungs every 6 (six) hours as needed for wheezing or shortness of breath. (Patient not taking: Reported on 11/28/2018) 1 Inhaler 0  . Febuxostat (ULORIC) 80 MG TABS Take by mouth.     No current facility-administered medications for this visit.    Allergies  Allergen Reactions  . Allopurinol Rash    Health Maintenance Health Maintenance  Topic Date Due  . TETANUS/TDAP  12/27/2027   . INFLUENZA VACCINE  Completed  . HIV Screening  Completed     Exam:  BP 120/83   Pulse 81   Wt 230 lb (104.3 kg)   BMI 33.97 kg/m  Wt Readings from Last 5 Encounters:  01/14/19 230 lb (104.3 kg)  11/28/18 228 lb (103.4 kg)  11/08/17 242 lb (109.8 kg)  09/28/17 238 lb (108 kg)  08/17/17 240 lb (108.9 kg)      Gen: Well NAD HEENT: EOMI,  MMM Lungs: Normal work of breathing. CTABL Heart: RRR no MRG Abd: NABS, Soft. Nondistended, Nontender Exts: Brisk capillary refill, warm and well perfused.  Psych: Certain oriented normal speech thought process and affect. MSK: L-spine nontender normal lumbar motion. Left hip normal.  Normal motion tender palpation greater trochanter hip abduction strength 4/5.  [facility] 01/14/2019 11/22/2016  Decreased Interest 0 0  Down, Depressed, Hopeless 0 0  PHQ - 2 Score 0 0       Lab and Radiology Results Recent Results (from the past 2160 hour(s))  CBC with Differential/Platelet     Status: None   Collection Time: 11/28/18  2:53 PM  Result Value Ref Range   WBC 7.4 3.8 - 10.8 Thousand/uL   RBC 5.28 4.20 - 5.80 Million/uL   Hemoglobin 16.0 13.2 - 17.1 g/dL   HCT 45.446.8 09.838.5 - 11.950.0 %   MCV 88.6 80.0 - 100.0 fL   MCH 30.3 27.0 - 33.0 pg   MCHC 34.2 32.0 - 36.0 g/dL   RDW 14.713.0 82.911.0 -  15.0 %   Platelets 225 140 - 400 Thousand/uL   MPV 11.5 7.5 - 12.5 fL   Neutro Abs 4,706 1,500 - 7,800 cells/uL   Lymphs Abs 2,035 850 - 3,900 cells/uL   Absolute Monocytes 518 200 - 950 cells/uL   Eosinophils Absolute 118 15 - 500 cells/uL   Basophils Absolute 22 0 - 200 cells/uL   Neutrophils Relative % 63.6 %   Total Lymphocyte 27.5 %   Monocytes Relative 7.0 %   Eosinophils Relative 1.6 %   Basophils Relative 0.3 %  COMPLETE METABOLIC PANEL WITH GFR     Status: None   Collection Time: 11/28/18  2:53 PM  Result Value Ref Range   Glucose, Bld 88 65 - 99 mg/dL    Comment: .            Fasting reference interval .    BUN 12 7 - 25  mg/dL   Creat 6.46 8.03 - 2.12 mg/dL   GFR, Est Non African American 101 > OR = 60 mL/min/1.53m2   GFR, Est African American 117 > OR = 60 mL/min/1.33m2   BUN/Creatinine Ratio NOT APPLICABLE 6 - 22 (calc)   Sodium 140 135 - 146 mmol/L   Potassium 4.0 3.5 - 5.3 mmol/L   Chloride 102 98 - 110 mmol/L   CO2 28 20 - 32 mmol/L   Calcium 9.9 8.6 - 10.3 mg/dL   Total Protein 7.8 6.1 - 8.1 g/dL   Albumin 4.8 3.6 - 5.1 g/dL   Globulin 3.0 1.9 - 3.7 g/dL (calc)   AG Ratio 1.6 1.0 - 2.5 (calc)   Total Bilirubin 0.5 0.2 - 1.2 mg/dL   Alkaline phosphatase (APISO) 65 36 - 130 U/L   AST 22 10 - 40 U/L   ALT 41 9 - 46 U/L  Lipid Panel w/reflex Direct LDL     Status: Abnormal   Collection Time: 11/28/18  2:53 PM  Result Value Ref Range   Cholesterol 180 <200 mg/dL   HDL 46 > OR = 40 mg/dL   Triglycerides 248 <250 mg/dL   LDL Cholesterol (Calc) 109 (H) mg/dL (calc)    Comment: Reference range: <100 . Desirable range <100 mg/dL for primary prevention;   <70 mg/dL for patients with CHD or diabetic patients  with > or = 2 CHD risk factors. Marland Kitchen LDL-C is now calculated using the Martin-Hopkins  calculation, which is a validated novel method providing  better accuracy than the Friedewald equation in the  estimation of LDL-C.  Horald Pollen et al. Lenox Ahr. 0370;488(89): 2061-2068  (http://education.[facility].com/faq/FAQ164)    Total CHOL/HDL Ratio 3.9 <5.0 (calc)   Non-HDL Cholesterol (Calc) 134 (H) <130 mg/dL (calc)    Comment: For patients with diabetes plus 1 major ASCVD risk  factor, treating to a non-HDL-C goal of <100 mg/dL  (LDL-C of <16 mg/dL) is considered a therapeutic  option.   TSH     Status: None   Collection Time: 11/28/18  2:53 PM  Result Value Ref Range   TSH 1.72 0.40 - 4.50 mIU/L  Uric acid     Status: None   Collection Time: 11/28/18  2:53 PM  Result Value Ref Range   Uric Acid, Serum 6.9 4.0 - 8.0 mg/dL    Comment: Therapeutic target for gout patients: <6.0 mg/dL .    Lipase     Status: None   Collection Time: 11/28/18  2:53 PM  Result Value Ref Range   Lipase 14 7 - 60 U/L  Stool culture     Status: None   Collection Time: 11/28/18  3:47 PM   Specimen: Stool  Result Value Ref Range   MICRO NUMBER: [id number]    SPECIMEN QUALITY: Adequate    SOURCE: STOOL    STATUS: FINAL    SHIGA RESULT: Not Detected     Comment: Reference Range:Not Detected   MICRO NUMBER: [id number]    SPECIMEN QUALITY: Adequate    Source NOT GIVEN    STATUS: FINAL    CAM RESULT: No enteric Campylobacter isolated    MICRO NUMBER: [id number]    SPECIMEN QUALITY: Adequate    SOURCE: STOOL    STATUS: FINAL    SS RESULT: No Salmonella or Shigella isolated   Ova and Parasite Examination     Status: None   Collection Time: 11/28/18  3:47 PM   Specimen: Stool  Result Value Ref Range   MICRO NUMBER: [id number]    SPECIMEN QUALITY: Adequate    Source STOOL    STATUS: FINAL    CONCENTRATE RESULT: No ova or parasites seen    TRICHROME RESULT: No ova or parasites seen    COMMENT:      Routine Ova and Parasite exam may not detect some parasites that occasionally cause diarrheal illness. Test code(s) 92330 (Cryptosporidium Ag., DFA) and/or 10018 (Cyclospora and Isospora Exam) may be ordered to detect these parasites. One negative sample  does not necessarily rule out the presence of a parasitic infection.  For additional information, please refer to https://education.[facility].com/faq/FAQ203 (This link is being provided for informational/ educational purposes only.)   Stool, WBC/Lactoferrin     Status: None   Collection Time: 11/28/18  3:47 PM  Result Value Ref Range   MICRO NUMBER: [id number]    SPECIMEN QUALITY: Adequate    Source NOT GIVEN    STATUS: FINAL    Fecal Lactoferrin Negative    COMMENT:      Lactoferrin in the stool is a marker for fecal leukocytes and is a non-specific indicator of intestinal inflammation that may be detected in patients with acute infectious  colitis or inflammatory bowel disease. The diagnosis of an acute infectious  process or active IBD cannot be established solely on the basis of a positive result. This test may not be appropriate for immunocompromised persons. In addition, this test is not FDA cleared for patients with a history of HIV and/or Hepatitis B and C,  patients with a history of infectious diarrhea (within 6 months), and patients having had a colostomy and/or ileostomy within 1 month.       Assessment and Plan: 38 y.o. male with well adult.  Doing reasonably well.  Plan to follow-up with gastroenterology as previously arranged.  May benefit from colonoscopy to follow-up blood in the stool and diarrhea.  Additionally patient is having some back pain likely due to myofascial strain and dysfunction and left lateral hip pain due to trochanteric bursitis/hip abductor tendinopathy.  Plan for home exercise program if not improving neck step would be physical therapy.  Follow-up as needed in 6 to 12 months.  Establish care with new PCP in clinic.  PDMP not reviewed this encounter. No orders of the defined types were placed in this encounter.  No orders of the defined types were placed in this encounter.    Discussed warning signs or symptoms. Please see discharge instructions. Patient expresses understanding.

## 2019-01-17 ENCOUNTER — Encounter: Payer: Self-pay | Admitting: Family Medicine

## 2019-02-03 ENCOUNTER — Other Ambulatory Visit: Payer: Self-pay

## 2019-02-03 ENCOUNTER — Encounter: Payer: Self-pay | Admitting: Gastroenterology

## 2019-02-03 ENCOUNTER — Ambulatory Visit: Payer: BC Managed Care – PPO | Admitting: Gastroenterology

## 2019-02-03 VITALS — BP 126/90 | HR 82 | Temp 98.4°F | Ht 68.0 in | Wt 233.0 lb

## 2019-02-03 DIAGNOSIS — K580 Irritable bowel syndrome with diarrhea: Secondary | ICD-10-CM

## 2019-02-03 MED ORDER — DICYCLOMINE HCL 10 MG PO CAPS: 10.0000 mg | ORAL_CAPSULE | Freq: Two times a day (BID) | ORAL | 0 refills | Status: DC

## 2019-02-03 NOTE — Progress Notes (Signed)
Chief Complaint:   Referring Provider:  Gregor Hams, MD      ASSESSMENT AND PLAN;   #1.  Diarrhea-likely due to IBS. Neg stool studies including stool for WBCs (last test neg), nl CBC, CMP. TSH 11/2018.  No red flag symptoms. Has assoc lactose intolerance.  Plan:  -I have reassured the patient.  I advised him to start walking 20-30 minutes/day. -Change in eating habits.  I have encouraged him to start eating breakfast.  -Trial of Benty 10mg  po bid, 1/2 hr before meals and bedtime. -Call in 2 weels -FU in12 weeks. If still with problems, check stool for calprotectin, check sed rate, CRP, CBC/CMP. -I have discussed extensively with the patient.  He would like to hold off on colonoscopy at the present time.  I do agree at this time.  He is willing to consider if he starts having any rectal bleeding, continued symptoms, weight loss or otherwise any change.   HPI:    Connor Jefferson is a 38 y.o. male  Very pleasant male With diarrhea x over 1 year, associated with softer bowel movements, prominent postprandial symptoms, 2 to 3/day.  With abdominal bloating and lower abdominal discomfort which gets better with defecation.  Had noticed some bright red blood per rectum which is attributed to hemorrhoids.  Completely resolved.  Stool studies were negative for cultures, positive for WBCs.  All the blood tests including electrolytes were normal.  No weight loss or loss of appetite.  No fever chills or night sweats.  He would like to hold off on colonoscopy for now.  SH-married, 2 kids (4,5), Agricultural consultant for [employer], very stressful job.Marland Kitchen Has been working in this job for last 12 years.  He is from [place].  Loves soccer. Past Medical History:  Diagnosis Date  . Gout   . Hx of appendectomy   . Obesity     Past Surgical History:  Procedure Laterality Date  . APPENDECTOMY      Family History  Problem Relation Age of Onset  . Diabetes Paternal Grandfather   . Leukemia  Mother        deceased  . Non-Hodgkin's lymphoma Mother   . Healthy Father     Social History   Tobacco Use  . Smoking status: Never Smoker  . Smokeless tobacco: Never Used  Substance Use Topics  . Alcohol use: Yes    Comment: socially/ once a month   . Drug use: No    Current Outpatient Medications  Medication Sig Dispense Refill  . Cholecalciferol (VITAMIN D3 PO) Take 1 tablet by mouth daily.    . Coenzyme Q10 (CO Q 10 PO) Take 1 capsule by mouth daily.    . Cyanocobalamin (VITAMIN B-12 PO) Take 1 tablet by mouth daily.    . Febuxostat (ULORIC) 80 MG TABS Take by mouth.    . Multiple Vitamin (MULTIVITAMIN) tablet Take 1 tablet by mouth daily.    . Probiotic Product (PROBIOTIC PO) Take 1 tablet by mouth daily.    . vitamin C (ASCORBIC ACID) 500 MG tablet Take 500 mg by mouth daily.     No current facility-administered medications for this visit.     Allergies  Allergen Reactions  . Allopurinol Rash    Review of Systems:  Constitutional: Denies fever, chills, diaphoresis, appetite change and fatigue.  HEENT: Denies photophobia, eye pain, redness, hearing loss, ear pain, congestion, sore throat, rhinorrhea, sneezing, mouth sores, neck pain, neck stiffness and tinnitus.   Respiratory:  Denies SOB, DOE, cough, chest tightness,  and wheezing.   Cardiovascular: Denies chest pain, palpitations and leg swelling.  Genitourinary: Denies dysuria, urgency, frequency, hematuria, flank pain and difficulty urinating.  Musculoskeletal: Denies myalgias, back pain, joint swelling, arthralgias and gait problem.  Skin: No rash.  Neurological: Denies dizziness, seizures, syncope, weakness, light-headedness, numbness and headaches.  Hematological: Denies adenopathy. Easy bruising, personal or family bleeding history  Psychiatric/Behavioral: No anxiety or depression     Physical Exam:    Temp 98.4 F (36.9 C)   Ht 5\' 8"  (1.727 m)   Wt 233 lb (105.7 kg)   BMI 35.43 kg/m  Filed  Weights   02/03/19 1528  Weight: 233 lb (105.7 kg)   Constitutional:  Well-developed, in no acute distress. Psychiatric: Normal mood and affect. Behavior is normal. HEENT: Pupils normal.  Conjunctivae are normal. No scleral icterus. Neck supple.  Cardiovascular: Normal rate, regular rhythm. No edema Pulmonary/chest: Effort normal and breath sounds normal. No wheezing, rales or rhonchi. Abdominal: Soft, nondistended. Nontender. Bowel sounds active throughout. There are no masses palpable. No hepatomegaly. Rectal:  defered Neurological: Alert and oriented to person place and time. Skin: Skin is warm and dry. No rashes noted.  Data Reviewed: I have personally reviewed following labs and imaging studies  CBC: CBC Latest Ref Rng & Units 11/28/2018 06/13/2017 12/20/2015  WBC 3.8 - 10.8 Thousand/uL 7.4 7.7 6.9  Hemoglobin 13.2 - 17.1 g/dL 02/19/2016 17.9(H) 15.8  Hematocrit 38.5 - 50.0 % 46.8 50.7(H) 46.0  Platelets 140 - 400 Thousand/uL 225 246 220    CMP: CMP Latest Ref Rng & Units 11/28/2018 06/13/2017 12/20/2015  Glucose 65 - 99 mg/dL 88 91 91  BUN 7 - 25 mg/dL 12 12 15   Creatinine 0.60 - 1.35 mg/dL 02/19/2016 0.25  Sodium 135 - 146 mmol/L 140 140 139  Potassium 3.5 - 5.3 mmol/L 4.0 4.2 4.1  Chloride 98 - 110 mmol/L 102 102 100  CO2 20 - 32 mmol/L 28 29 29   Calcium 8.6 - 10.3 mg/dL 9.9 9.9 9.6  Total Protein 6.1 - 8.1 g/dL 7.8 8.52) 7.5  Total Bilirubin 0.2 - 1.2 mg/dL 0.5 0.5 0.6  Alkaline Phos 40 - 115 U/L - - 62  AST 10 - 40 U/L 22 40 27  ALT 9 - 46 U/L 41 58(H) 39      7.78, MD 02/03/2019, 3:37 PM  Cc: 2.4(M, MD

## 2019-02-03 NOTE — Patient Instructions (Signed)
If you are age 37 or older, your body mass index should be between 23-30. Your Body mass index is 35.43 kg/m. If this is out of the aforementioned range listed, please consider follow up with your Primary Care Provider.  If you are age 88 or younger, your body mass index should be between 19-25. Your Body mass index is 35.43 kg/m. If this is out of the aformentioned range listed, please consider follow up with your Primary Care Provider.   We have sent the following medications to your pharmacy for you to pick up at your convenience: Bentyl 10 mg   Start walking 20 min a day.  Eat light breakfast.   Please call Dr. Leland Her nurse Karl Pock, RN)  in 2 weeks at [phone]  to let her now how you are doing.   Follow up in 12 weeks.   Thank you,  Dr. Jackquline Denmark

## 2019-02-17 DIAGNOSIS — Z20828 Contact with and (suspected) exposure to other viral communicable diseases: Secondary | ICD-10-CM | POA: Diagnosis not present

## 2019-02-18 ENCOUNTER — Telehealth: Payer: Self-pay

## 2019-02-18 DIAGNOSIS — R05 Cough: Secondary | ICD-10-CM | POA: Diagnosis not present

## 2019-02-18 DIAGNOSIS — R9431 Abnormal electrocardiogram [ECG] [EKG]: Secondary | ICD-10-CM | POA: Diagnosis not present

## 2019-02-18 DIAGNOSIS — R000 Tachycardia, unspecified: Secondary | ICD-10-CM | POA: Diagnosis not present

## 2019-02-18 DIAGNOSIS — R0602 Shortness of breath: Secondary | ICD-10-CM | POA: Diagnosis not present

## 2019-02-18 DIAGNOSIS — Z20828 Contact with and (suspected) exposure to other viral communicable diseases: Secondary | ICD-10-CM | POA: Diagnosis not present

## 2019-02-18 DIAGNOSIS — K580 Irritable bowel syndrome with diarrhea: Secondary | ICD-10-CM

## 2019-02-18 NOTE — Telephone Encounter (Signed)
Lets try Bentyl 10 mg 4 times daily half an hour before meals and bedtime for 2 weeks.  May have to call in a prescription Let us know how he is in 2 weeks.  RG

## 2019-02-18 NOTE — Telephone Encounter (Signed)
Patient called into office to give an update on symptoms -he has been using the Bentyl BID and has had a return in symptoms -patient reports he has been staying away from foods/drink that would trigger his symptoms -has been increasing oral fluids to prevent dehydration  Would like to be contacted via MyChart with response-  Please advise of next step in patient's plan of care

## 2019-02-19 MED ORDER — DICYCLOMINE HCL 10 MG PO CAPS: 10.0000 mg | ORAL_CAPSULE | Freq: Three times a day (TID) | ORAL | 0 refills | Status: DC

## 2019-02-25 ENCOUNTER — Other Ambulatory Visit: Payer: Self-pay | Admitting: Gastroenterology

## 2019-03-10 ENCOUNTER — Other Ambulatory Visit: Payer: Self-pay

## 2019-03-10 DIAGNOSIS — K580 Irritable bowel syndrome with diarrhea: Secondary | ICD-10-CM

## 2019-03-11 ENCOUNTER — Other Ambulatory Visit: Payer: Self-pay

## 2019-03-11 DIAGNOSIS — K580 Irritable bowel syndrome with diarrhea: Secondary | ICD-10-CM

## 2019-03-11 MED ORDER — DICYCLOMINE HCL 10 MG PO CAPS: 10.0000 mg | ORAL_CAPSULE | Freq: Three times a day (TID) | ORAL | 6 refills | Status: DC

## 2019-03-24 DIAGNOSIS — M1A09X1 Idiopathic chronic gout, multiple sites, with tophus (tophi): Secondary | ICD-10-CM | POA: Diagnosis not present

## 2019-04-01 ENCOUNTER — Other Ambulatory Visit: Payer: Self-pay | Admitting: Gastroenterology

## 2019-04-01 DIAGNOSIS — K580 Irritable bowel syndrome with diarrhea: Secondary | ICD-10-CM

## 2019-04-17 ENCOUNTER — Ambulatory Visit
Admission: EM | Admit: 2019-04-17 | Discharge: 2019-04-17 | Disposition: A | Discharge status: Home / Self Care | Payer: BC Managed Care – PPO | Attending: Emergency Medicine | Admitting: Emergency Medicine

## 2019-04-17 ENCOUNTER — Other Ambulatory Visit: Payer: Self-pay

## 2019-04-17 ENCOUNTER — Encounter: Payer: Self-pay | Admitting: Emergency Medicine

## 2019-04-17 DIAGNOSIS — H6242 Otitis externa in other diseases classified elsewhere, left ear: Secondary | ICD-10-CM

## 2019-04-17 DIAGNOSIS — H9312 Tinnitus, left ear: Secondary | ICD-10-CM | POA: Diagnosis not present

## 2019-04-17 DIAGNOSIS — B369 Superficial mycosis, unspecified: Secondary | ICD-10-CM | POA: Diagnosis not present

## 2019-04-17 DIAGNOSIS — R030 Elevated blood-pressure reading, without diagnosis of hypertension: Secondary | ICD-10-CM | POA: Diagnosis not present

## 2019-04-17 MED ORDER — CLOTRIMAZOLE 1 % EX SOLN: 1.0000 "application " | Freq: Two times a day (BID) | CUTANEOUS | 0 refills | Status: AC

## 2019-04-17 NOTE — ED Triage Notes (Signed)
Pt presents to [facility] for assessment of pain, pressure, and "clogged" of left ear x 4 days.  States its making his tinnitus worse.

## 2019-04-17 NOTE — Discharge Instructions (Signed)
Use eardrops as prescribed. Do not stick anything in your ear other than these eardrops. Important to follow-up with ENT in 1 week for reevaluation. They can also treat you for tinnitus. Follow-up with ENT sooner if you develop worsening hearing, dizziness, fever.

## 2019-04-17 NOTE — ED Provider Notes (Signed)
[facility]    CSN: [id number] Arrival date & time: 04/17/19  1624      History   Chief Complaint Chief Complaint  Patient presents with  . Otalgia    HPI Connor Jefferson is a 39 y.o. male with history of gout, obesity, tinnitus presenting for left ear discomfort.  States has been going on for 4 days and is described as a pain, pressure, clogging sensation.  Denies discharge, trauma to the area.  Patient has noticed increasing tinnitus: Denies dizziness, fever, nausea, vomiting.  Patient does use Q-tips to clean his ears: Denies bloody discharge, history of ruptured TM.  Past Medical History:  Diagnosis Date  . Gout   . Hx of appendectomy   . Obesity     Patient Active Problem List   Diagnosis Date Noted  . Thyromegaly 08/13/2017  . Diarrhea 06/13/2017  . 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 Right knee pain 12/20/2015  . Gout 03/24/2013  . Hematospermia 01/07/2012    Past Surgical History:  Procedure Laterality Date  . APPENDECTOMY         Home Medications    Prior to Admission medications   Medication Sig Start Date End Date Taking? Authorizing Provider  Cholecalciferol (VITAMIN D3 PO) Take 1 tablet by mouth daily.    [provider]  clotrimazole (LOTRIMIN) 1 % external solution Apply 1 application topically 2 (two) times daily for 7 days. For use in left ear 04/17/19 04/24/19  Hall-Potvin, [place], PA-C  Coenzyme Q10 (CO Q 10 PO) Take 1 capsule by mouth daily.    [provider]  Cyanocobalamin (VITAMIN B-12 PO) Take 1 tablet by mouth daily.    [provider]  dicyclomine (BENTYL) 10 MG capsule Take 1 capsule (10 mg total) by mouth 4 (four) times daily -  before meals and at bedtime. Take medication half an hour before meals and bedtime for 2 weeks. 03/11/19   Jackquline Denmark, MD  Febuxostat (ULORIC) 80 MG TABS Take by mouth. 04/27/15   [provider]  Multiple Vitamin (MULTIVITAMIN) tablet Take  1 tablet by mouth daily.    [provider]  Probiotic Product (PROBIOTIC PO) Take 1 tablet by mouth daily.    [provider]  vitamin C (ASCORBIC ACID) 500 MG tablet Take 500 mg by mouth daily.    [provider]    Family History Family History  Problem Relation Age of Onset  . Diabetes Paternal Grandfather   . Leukemia Mother        deceased  . Non-Hodgkin's lymphoma Mother   . Healthy Father     Social History Social History   Tobacco Use  . Smoking status: Never Smoker  . Smokeless tobacco: Never Used  Substance Use Topics  . Alcohol use: Yes    Comment: socially/ once a month   . Drug use: No     Allergies   Allopurinol   Review of Systems Review of Systems  Constitutional: Negative for fatigue and fever.  HENT: Positive for ear pain. Negative for congestion, dental problem, ear discharge, facial swelling, hearing loss, sinus pain, sore throat, trouble swallowing and voice change.   Eyes: Negative for photophobia, pain and visual disturbance.  Respiratory: Negative for cough and shortness of breath.   Cardiovascular: Negative for chest pain and palpitations.  Gastrointestinal: Negative for diarrhea and vomiting.  Musculoskeletal: Negative for arthralgias and myalgias.  Neurological: Negative for dizziness and headaches.    Physical Exam Triage  Vital Signs ED Triage Vitals  Enc Vitals Group     BP 04/17/19 1639 (!) 159/99     Pulse Rate 04/17/19 1639 98     Resp 04/17/19 1639 16     Temp 04/17/19 1639 97.8 F (36.6 C)     Temp Source 04/17/19 1639 Temporal     SpO2 04/17/19 1639 98 %     Weight --      Height --      Head Circumference --      Peak Flow --      Pain Score 04/17/19 1640 8     Pain Loc --      Pain Edu? --      Excl. in GC? --    No data found.  Updated Vital Signs BP (!) 159/99 (BP Location: Left Arm)   Pulse 98   Temp 97.8 F (36.6 C) (Temporal)   Resp 16   SpO2 98%   Visual Acuity Right Eye  Distance:   Left Eye Distance:   Bilateral Distance:    Right Eye Near:   Left Eye Near:    Bilateral Near:     Physical Exam Constitutional:      General: He is not in acute distress.    Appearance: Normal appearance. He is obese. He is not ill-appearing.  HENT:     Head: Normocephalic and atraumatic.     Right Ear: Tympanic membrane, ear canal and external ear normal.     Left Ear: External ear normal.     Ears:     Comments: Negative tragal tenderness bilaterally.  Left EAC with moderate amount of discharge, fungal spores.  No foreign body, perforated TM, blood.    Mouth/Throat:     Mouth: Mucous membranes are moist.     Pharynx: Oropharynx is clear.  Eyes:     General: No scleral icterus.    Conjunctiva/sclera: Conjunctivae normal.     Pupils: Pupils are equal, round, and reactive to light.  Cardiovascular:     Rate and Rhythm: Normal rate.  Pulmonary:     Effort: Pulmonary effort is normal. No respiratory distress.     Breath sounds: No wheezing.  Musculoskeletal:     Cervical back: Normal range of motion and neck supple. No tenderness.  Lymphadenopathy:     Cervical: No cervical adenopathy.  Skin:    Coloration: Skin is not jaundiced or pale.  Neurological:     Mental Status: He is alert and oriented to person, place, and time.      UC Treatments / Results  Labs (all labs ordered are listed, but only abnormal results are displayed) Labs Reviewed - No data to display  EKG   Radiology No results found.  Procedures Procedures (including critical care time)  Medications Ordered in UC Medications - No data to display  Initial Impression / Assessment and Plan / UC Course  I have reviewed the triage vital signs and the nursing notes.  Pertinent labs & imaging results that were available during my care of the patient were reviewed by me and considered in my medical decision making (see chart for details).     Patient afebrile, nontoxic in office today.   Patient does have elevated blood pressure, though does state he is in significant pain.  No history of hypertension: We will follow-up with PCP for further evaluation/management as needed.  H&P consistent with acute fungal otitis externa: We will start clotrimazole solution twice daily for the next week,  follow-up with ENT for further evaluation if needed as well as for care for chronic tinnitus.  Return precautions discussed, patient verbalized understanding and is agreeable to plan. Final Clinical Impressions(s) / UC Diagnoses   Final diagnoses:  Otitis externa, fungal, left ear  Tinnitus of left ear  Elevated blood pressure reading without diagnosis of hypertension     Discharge Instructions     Use eardrops as prescribed. Do not stick anything in your ear other than these eardrops. Important to follow-up with ENT in 1 week for reevaluation. They can also treat you for tinnitus. Follow-up with ENT sooner if you develop worsening hearing, dizziness, fever.    ED Prescriptions    Medication Sig Dispense Auth. Provider   clotrimazole (LOTRIMIN) 1 % external solution Apply 1 application topically 2 (two) times daily for 7 days. For use in left ear 30 mL Hall-Potvin, [place], PA-C     PDMP not reviewed this encounter.   Hall-Potvin, [place], [place] 04/17/19 1804

## 2019-04-18 ENCOUNTER — Encounter: Payer: Self-pay | Admitting: Family Medicine

## 2019-04-25 DIAGNOSIS — H7112 Cholesteatoma of tympanum, left ear: Secondary | ICD-10-CM | POA: Diagnosis not present

## 2019-04-25 DIAGNOSIS — H6092 Unspecified otitis externa, left ear: Secondary | ICD-10-CM | POA: Diagnosis not present

## 2019-04-30 DIAGNOSIS — H6092 Unspecified otitis externa, left ear: Secondary | ICD-10-CM | POA: Diagnosis not present

## 2019-04-30 DIAGNOSIS — H7112 Cholesteatoma of tympanum, left ear: Secondary | ICD-10-CM | POA: Diagnosis not present

## 2019-05-05 ENCOUNTER — Other Ambulatory Visit: Payer: Self-pay

## 2019-05-05 ENCOUNTER — Telehealth (INDEPENDENT_AMBULATORY_CARE_PROVIDER_SITE_OTHER): Payer: BC Managed Care – PPO | Admitting: Gastroenterology

## 2019-05-05 VITALS — Ht 68.0 in | Wt 232.0 lb

## 2019-05-05 DIAGNOSIS — K580 Irritable bowel syndrome with diarrhea: Secondary | ICD-10-CM

## 2019-05-05 MED ORDER — DICYCLOMINE HCL 10 MG PO CAPS: 10.0000 mg | ORAL_CAPSULE | Freq: Three times a day (TID) | ORAL | 11 refills | Status: AC

## 2019-05-05 NOTE — Progress Notes (Signed)
Chief Complaint:   Referring Provider:  Gregor Hams, MD      ASSESSMENT AND PLAN;   #1.  Diarrhea-likely due to IBS. Neg stool studies including stool for WBCs (last test neg), nl CBC, CMP. TSH 11/2018.  No red flag symptoms. Has assoc lactose intolerance.  Resolved with Bentyl.  Plan:  -Continue Bentyl 10mg  po QID ([facility] scripts), 1/2 hr before meals and bedtime. #120, 11 refills. -FU in 1 year.  -If any further problems or any red flag symptoms (rectal bleeding, unexplained weight loss, continued symptoms), then colonoscopy.  Otherwise, we would hold off on colonoscopy for now.   HPI:    Connor Jefferson is a 39 y.o. male  Very pleasant male Doing very well on Bentyl. No red flag symptoms No rectal bleeding No abdominal pain or unexplained weight loss. Pleased with the progress.  Would like to hold off on colonoscopy.  I do agree.    SH-married, 2 kids (4,5), Agricultural consultant for [employer], very stressful job.Marland Kitchen Has been working in this job for last 12 years.  He is from [place].  Loves soccer. Past Medical History:  Diagnosis Date  . Gout   . Hx of appendectomy   . Obesity     Past Surgical History:  Procedure Laterality Date  . APPENDECTOMY      Family History  Problem Relation Age of Onset  . Diabetes Paternal Grandfather   . Leukemia Mother        deceased  . Non-Hodgkin's lymphoma Mother   . Healthy Father     Social History   Tobacco Use  . Smoking status: Never Smoker  . Smokeless tobacco: Never Used  Substance Use Topics  . Alcohol use: Yes    Comment: socially/ once a month   . Drug use: No    Current Outpatient Medications  Medication Sig Dispense Refill  . Cholecalciferol (VITAMIN D3 PO) Take 1 tablet by mouth daily.    . Coenzyme Q10 (CO Q 10 PO) Take 1 capsule by mouth daily.    . Cyanocobalamin (VITAMIN B-12 PO) Take 1 tablet by mouth daily.    Marland Kitchen dicyclomine (BENTYL) 10 MG capsule Take 1 capsule (10 mg total) by mouth 4 (four)  times daily -  before meals and at bedtime. Take medication half an hour before meals and bedtime for 2 weeks. 120 capsule 6  . Febuxostat (ULORIC) 80 MG TABS Take by mouth daily.     . Multiple Vitamin (MULTIVITAMIN) tablet Take 1 tablet by mouth daily.    . Probiotic Product (PROBIOTIC PO) Take 1 tablet by mouth daily.    . vitamin C (ASCORBIC ACID) 500 MG tablet Take 500 mg by mouth daily.     No current facility-administered medications for this visit.    Allergies  Allergen Reactions  . Allopurinol Rash    Review of Systems:  neg     Physical Exam:    Ht 5\' 8"  (1.727 m)   Wt 232 lb (105.2 kg)   BMI 35.28 kg/m  Filed Weights   05/05/19 0827  Weight: 232 lb (105.2 kg)   Constitutional:  Well-developed, in no acute distress. Televisit  Data Reviewed: I have personally reviewed following labs and imaging studies  CBC: CBC Latest Ref Rng & Units 11/28/2018 06/13/2017 12/20/2015  WBC 3.8 - 10.8 Thousand/uL 7.4 7.7 6.9  Hemoglobin 13.2 - 17.1 g/dL 16.0 17.9(H) 15.8  Hematocrit 38.5 - 50.0 % 46.8 50.7(H) 46.0  Platelets 140 -  400 Thousand/uL 225 246 220    CMP: CMP Latest Ref Rng & Units 11/28/2018 06/13/2017 12/20/2015  Glucose 65 - 99 mg/dL 88 91 91  BUN 7 - 25 mg/dL 12 12 15   Creatinine 0.60 - 1.35 mg/dL 9.87 2.15  Sodium 135 - 146 mmol/L 140 140 139  Potassium 3.5 - 5.3 mmol/L 4.0 4.2 4.1  Chloride 98 - 110 mmol/L 102 102 100  CO2 20 - 32 mmol/L 28 29 29   Calcium 8.6 - 10.3 mg/dL 9.9 9.9 9.6  Total Protein 6.1 - 8.1 g/dL 7.8 8.72) 7.5  Total Bilirubin 0.2 - 1.2 mg/dL 0.5 0.5 0.6  Alkaline Phos 40 - 115 U/L - - 62  AST 10 - 40 U/L 22 40 27  ALT 9 - 46 U/L 41 58(H) 39  I connected with  on 05/05/19 by a video enabled telemedicine application and verified that I am speaking with the correct person using two identifiers.   I discussed the limitations of evaluation and management by telemedicine. The patient expressed understanding and agreed to  proceed.  Time spent on call and coordination of care: 15 min    Milas Gain, MD 05/05/2019, 10:44 AM  Cc: Edman Circle, MD

## 2019-05-05 NOTE — Patient Instructions (Signed)
If you are age 38 or older, your body mass index should be between 23-30. Your Body mass index is 35.28 kg/m. If this is out of the aforementioned range listed, please consider follow up with your Primary Care Provider.  If you are age 49 or younger, your body mass index should be between 19-25. Your Body mass index is 35.28 kg/m. If this is out of the aformentioned range listed, please consider follow up with your Primary Care Provider.   We have sent the following medications to your pharmacy for you to pick up at your convenience: Bentyl  Follow up in 1 year.  Thank you,  Dr. Lynann Bologna

## 2019-07-21 ENCOUNTER — Encounter: Payer: Self-pay | Admitting: Family Medicine

## 2019-07-21 DIAGNOSIS — M545 Low back pain, unspecified: Secondary | ICD-10-CM

## 2019-07-23 ENCOUNTER — Telehealth (INDEPENDENT_AMBULATORY_CARE_PROVIDER_SITE_OTHER): Payer: BC Managed Care – PPO | Admitting: Family Medicine

## 2019-07-23 ENCOUNTER — Encounter: Payer: Self-pay | Admitting: Family Medicine

## 2019-07-23 DIAGNOSIS — M545 Low back pain, unspecified: Secondary | ICD-10-CM

## 2019-07-23 DIAGNOSIS — R197 Diarrhea, unspecified: Secondary | ICD-10-CM

## 2019-07-23 DIAGNOSIS — M1A0790 Idiopathic chronic gout, unspecified ankle and foot, without tophus (tophi): Secondary | ICD-10-CM

## 2019-07-23 NOTE — Assessment & Plan Note (Signed)
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  He has had relief with this in the past.

## 2019-07-23 NOTE — Progress Notes (Signed)
Patient is just doing a transfer of care and meeting you as new PCP.   No concerns.

## 2019-07-23 NOTE — Progress Notes (Signed)
Connor Jefferson - 39 y.o. male MRN [id number]  Date of birth: Mar 30, 1981   This visit type was conducted due to national recommendations for restrictions regarding the COVID-19 Pandemic (e.g. social distancing).  This format is felt to be most appropriate for this patient at this time.  All issues noted in this document were discussed and addressed.  No physical exam was performed (except for noted visual exam findings with Video Visits).  I discussed the limitations of evaluation and management by telemedicine and the availability of in person appointments. The patient expressed understanding and agreed to proceed.  I connected with@ on 07/23/19 at 10:50 AM EDT by a video enabled telemedicine application and verified that I am speaking with the correct person using two identifiers.  Present at visit: Everrett Coombe, DO Milas Gain   Patient Location: Home [address] [place] [address]   Provider location:   [facility]  Chief Complaint  Patient presents with  . Follow-up    HPI  Connor Jefferson is a 39 y.o. male who presents via audio/video conferencing for a telehealth visit today.  He is establishing care with me today.  He recently had some back pain from positioning at work and had requested referral for chiropractor.  He was able to get in touch with Dr. Denyse Amass who entered this for him.    He also has history of gout.  This is managed by rheumatology.  No flare in about 2 years.  Continues to take uloric.   He is seeing GI as well for IBS-D.  Symptoms well managed with bentyl as needed.   ROS:  A comprehensive ROS was completed and negative except as noted per HPI    ROS:  A comprehensive ROS was completed and negative except as noted per HPI  Past Medical History:  Diagnosis Date  . Gout   . Hx of appendectomy   . Obesity     Past Surgical History:  Procedure Laterality Date  . APPENDECTOMY      Family History  Problem Relation Age of Onset  . Diabetes  Paternal Grandfather   . Leukemia Mother        deceased  . Non-Hodgkin's lymphoma Mother   . Healthy Father     Social History   Socioeconomic History  . Marital status: Married    Spouse name: Not on file  . Number of children: Not on file  . Years of education: Not on file  . Highest education level: Not on file  Occupational History  . Not on file  Tobacco Use  . Smoking status: Never Smoker  . Smokeless tobacco: Never Used  Substance and Sexual Activity  . Alcohol use: Yes    Comment: socially/ once a month   . Drug use: No  . Sexual activity: Yes  Other Topics Concern  . Not on file  Social History Narrative  . Not on file   Social Determinants of Health   Financial Resource Strain:   . Difficulty of Paying Living Expenses:   Food Insecurity:   . Worried About Programme researcher, broadcasting/film/video in the Last Year:   . Barista in the Last Year:   Transportation Needs:   . Freight forwarder (Medical):   Marland Kitchen Lack of Transportation (Non-Medical):   Physical Activity:   . Days of Exercise per Week:   . Minutes of Exercise per Session:   Stress:   . Feeling of Stress :  Social Connections:   . Frequency of Communication with Friends and Family:   . Frequency of Social Gatherings with Friends and Family:   . Attends Religious Services:   . Active Member of Clubs or Organizations:   . Attends Archivist Meetings:   Marland Kitchen Marital Status:   Intimate Partner Violence:   . Fear of Current or Ex-Partner:   . Emotionally Abused:   Marland Kitchen Physically Abused:   . Sexually Abused:      Current Outpatient Medications:  .  Cholecalciferol (VITAMIN D3 PO), Take 1 tablet by mouth daily., Disp: , Rfl:  .  Cyanocobalamin (VITAMIN B-12 PO), Take 1 tablet by mouth daily., Disp: , Rfl:  .  dicyclomine (BENTYL) 10 MG capsule, Take 1 capsule (10 mg total) by mouth 4 (four) times daily -  before meals and at bedtime. Take medication half an hour before meals and bedtime., Disp: 120  capsule, Rfl: 11 .  Febuxostat (ULORIC) 80 MG TABS, Take by mouth daily. , Disp: , Rfl:  .  Multiple Vitamin (MULTIVITAMIN) tablet, Take 1 tablet by mouth daily., Disp: , Rfl:  .  Probiotic Product (PROBIOTIC PO), Take 1 tablet by mouth daily., Disp: , Rfl:  .  vitamin C (ASCORBIC ACID) 500 MG tablet, Take 500 mg by mouth daily., Disp: , Rfl:   EXAM:  VITALS per patient if applicable: Ht 5\' 8"  (1.727 m)   Wt 232 lb (105.2 kg)   BMI 35.28 kg/m   GENERAL: alert, oriented, appears well and in no acute distress  HEENT: atraumatic, conjunttiva clear, no obvious abnormalities on inspection of external nose and ears  NECK: normal movements of the head and neck  LUNGS: on inspection no signs of respiratory distress, breathing rate appears normal, no obvious gross SOB, gasping or wheezing  CV: no obvious cyanosis  MS: moves all visible extremities without noticeable abnormality  PSYCH/NEURO: pleasant and cooperative, no obvious depression or anxiety, speech and thought processing grossly intact  ASSESSMENT AND PLAN:  Discussed the following assessment and plan:  Diarrhea Followed by GI with dx of IBS-D.  Started on bentyl which is helpful.   Gout No gout flare x2 years.  Managed by rheumatology.  Stable on uloric.   Low back pain Dr. Georgina Snell has already entered referral to chiropractor.  He has had relief with this in the past.   20 minutes spent including pre visit preparation, review of prior notes and labs, encounter with patient via video visit and same day documentation.    I discussed the assessment and treatment plan with the patient. The patient was provided an opportunity to ask questions and all were answered. The patient agreed with the plan and demonstrated an understanding of the instructions.   The patient was advised to call back or seek an in-person evaluation if the symptoms worsen or if the condition fails to improve as anticipated.    Luetta Nutting, DO

## 2019-07-23 NOTE — Assessment & Plan Note (Signed)
Followed by GI with dx of IBS-D.  Started on bentyl which is helpful.

## 2019-07-23 NOTE — Assessment & Plan Note (Signed)
No gout flare x2 years.  Managed by rheumatology.  Stable on uloric.

## 2019-10-08 ENCOUNTER — Other Ambulatory Visit: Payer: Self-pay

## 2019-10-08 ENCOUNTER — Encounter: Payer: Self-pay | Admitting: Family Medicine

## 2019-10-08 ENCOUNTER — Ambulatory Visit: Payer: BC Managed Care – PPO | Admitting: Family Medicine

## 2019-10-08 VITALS — BP 168/103 | HR 85 | Temp 97.7°F | Ht 68.11 in | Wt 243.5 lb

## 2019-10-08 DIAGNOSIS — R0681 Apnea, not elsewhere classified: Secondary | ICD-10-CM

## 2019-10-08 DIAGNOSIS — R5383 Other fatigue: Secondary | ICD-10-CM | POA: Diagnosis not present

## 2019-10-08 DIAGNOSIS — M1A0790 Idiopathic chronic gout, unspecified ankle and foot, without tophus (tophi): Secondary | ICD-10-CM

## 2019-10-08 NOTE — Assessment & Plan Note (Addendum)
Likely due to OSA but will check CMP, CBC and TSH as well to be thorough.   Referral entered for sleep study eval.

## 2019-10-08 NOTE — Progress Notes (Signed)
Connor Jefferson - 39 y.o. male MRN [id number]  Date of birth: Mar 30, 1981  Subjective Chief Complaint  Patient presents with  . Fatigue    HPI Connor Jefferson is a 39 y.o. male with history of gout here today with complaint of fatigue.  He reports increased fatigue over the past few months.  He thinks this may be due to sleep apnea as his wife has witnessed episodes of apnea and he is a heavy snorer.  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    STOP-BANG: 7 ESS: 18  ROS:  A comprehensive ROS was completed and negative except as noted per HPI  Allergies  Allergen Reactions  . Allopurinol Rash    Past Medical History:  Diagnosis Date  . Gout   . Hx of appendectomy   . Obesity     Past Surgical History:  Procedure Laterality Date  . APPENDECTOMY      Social History   Socioeconomic History  . Marital status: Married    Spouse name: Not on file  . Number of children: Not on file  . Years of education: Not on file  . Highest education level: Not on file  Occupational History  . Not on file  Tobacco Use  . Smoking status: Never Smoker  . Smokeless tobacco: Never Used  Vaping Use  . Vaping Use: Never used  Substance and Sexual Activity  . Alcohol use: Yes    Comment: socially/ once a month   . Drug use: No  . Sexual activity: Yes  Other Topics Concern  . Not on file  Social History Narrative  . Not on file   Social Determinants of Health   Financial Resource Strain:   . Difficulty of Paying Living Expenses:   Food Insecurity:   . Worried About Programme researcher, broadcasting/film/video in the Last Year:   . Barista in the Last Year:   Transportation Needs:   . Freight forwarder (Medical):   Marland Kitchen Lack of Transportation (Non-Medical):   Physical Activity:   . Days of Exercise per Week:   . Minutes of Exercise per Session:   Stress:   . Feeling of Stress :   Social Connections:   . Frequency of Communication with Friends and  Family:   . Frequency of Social Gatherings with Friends and Family:   . Attends Religious Services:   . Active Member of Clubs or Organizations:   . Attends Banker Meetings:   Marland Kitchen Marital Status:     Family History  Problem Relation Age of Onset  . Diabetes Paternal Grandfather   . Leukemia Mother        deceased  . Non-Hodgkin's lymphoma Mother   . Healthy Father     Health Maintenance  Topic Date Due  . COVID-19 Vaccine (1) Never done  . INFLUENZA VACCINE  11/09/2019  . TETANUS/TDAP  12/27/2027  . Hepatitis C Screening  Completed  . HIV Screening  Completed     ----------------------------------------------------------------------------------------------------------------------------------------------------------------------------------------------------------------- Physical Exam BP (!) 168/103 (BP Location: Left Arm, Patient Position: Sitting, Cuff Size: Large)   Pulse 85   Temp 97.7 F (36.5 C)   Ht 5' 8.11" (1.73 m)   Wt 243 lb 8 oz (110.5 kg)   SpO2 99%   BMI 36.90 kg/m   Physical Exam Constitutional:      Appearance: Normal appearance.  HENT:     Head: Normocephalic and  atraumatic.  Eyes:     General: No scleral icterus. Cardiovascular:     Rate and Rhythm: Normal rate and regular rhythm.  Pulmonary:     Effort: Pulmonary effort is normal.     Breath sounds: Normal breath sounds.  Skin:    General: Skin is warm and dry.  Neurological:     Mental Status: He is alert.  Psychiatric:        Mood and Affect: Mood normal.        Behavior: Behavior normal.     ------------------------------------------------------------------------------------------------------------------------------------------------------------------------------------------------------------------- Assessment and Plan  Fatigue Likely due to OSA but will check CMP, CBC and TSH as well to be thorough.   Referral entered for sleep study eval.     No orders of the  defined types were placed in this encounter.   No follow-ups on file.    This visit occurred during the SARS-CoV-2 public health emergency.  Safety protocols were in place, including screening questions prior to the visit, additional usage of staff PPE, and extensive cleaning of exam room while observing appropriate contact time as indicated for disinfecting solutions.

## 2019-10-08 NOTE — Patient Instructions (Signed)
Nice to see you today! Stop by the lab today  You should get a call to set up an appointment for sleep study.

## 2019-10-09 ENCOUNTER — Encounter: Payer: Self-pay | Admitting: Family Medicine

## 2019-10-09 LAB — COMPLETE METABOLIC PANEL WITH GFR
AG Ratio: 1.6 (calc) (ref 1.0–2.5)
ALT: 41 U/L (ref 9–46)
AST: 24 U/L (ref 10–40)
Albumin: 5.1 g/dL (ref 3.6–5.1)
Alkaline phosphatase (APISO): 70 U/L (ref 36–130)
BUN: 18 mg/dL (ref 7–25)
CO2: 29 mmol/L (ref 20–32)
Calcium: 10.1 mg/dL (ref 8.6–10.3)
Chloride: 100 mmol/L (ref 98–110)
Creat: 1.03 mg/dL (ref 0.60–1.35)
GFR, Est African American: 107 mL/min/{1.73_m2} (ref >=60)
GFR, Est Non African American: 92 mL/min/{1.73_m2} (ref >=60)
Globulin: 3.2 g/dL (calc) (ref 1.9–3.7)
Glucose, Bld: 103 mg/dL — ABNORMAL HIGH (ref 65–99)
Potassium: 4.2 mmol/L (ref 3.5–5.3)
Sodium: 139 mmol/L (ref 135–146)
Total Bilirubin: 0.4 mg/dL (ref 0.2–1.2)
Total Protein: 8.3 g/dL — ABNORMAL HIGH (ref 6.1–8.1)

## 2019-10-09 LAB — URIC ACID: Uric Acid, Serum: 4.1 mg/dL (ref 4.0–8.0)

## 2019-10-09 LAB — CBC
HCT: 49 % (ref 38.5–50.0)
Hemoglobin: 16.8 g/dL (ref 13.2–17.1)
MCH: 30.5 pg (ref 27.0–33.0)
MCHC: 34.3 g/dL (ref 32.0–36.0)
MCV: 89.1 fL (ref 80.0–100.0)
MPV: 10.9 fL (ref 7.5–12.5)
Platelets: 243 10*3/uL (ref 140–400)
RBC: 5.5 10*6/uL (ref 4.20–5.80)
RDW: 12.7 % (ref 11.0–15.0)
WBC: 9.1 10*3/uL (ref 3.8–10.8)

## 2019-10-09 LAB — TSH: TSH: 1.58 mIU/L (ref 0.40–4.50)

## 2019-11-03 ENCOUNTER — Institutional Professional Consult (permissible substitution): Payer: Self-pay | Admitting: Neurology

## 2019-12-08 ENCOUNTER — Encounter: Payer: Self-pay | Admitting: Neurology

## 2019-12-08 ENCOUNTER — Other Ambulatory Visit: Payer: Self-pay

## 2019-12-08 ENCOUNTER — Ambulatory Visit: Payer: BC Managed Care – PPO | Admitting: Neurology

## 2019-12-08 VITALS — BP 155/107 | HR 87 | Ht 68.0 in | Wt 247.0 lb

## 2019-12-08 DIAGNOSIS — R0683 Snoring: Secondary | ICD-10-CM

## 2019-12-08 DIAGNOSIS — G4719 Other hypersomnia: Secondary | ICD-10-CM | POA: Diagnosis not present

## 2019-12-08 DIAGNOSIS — G478 Other sleep disorders: Secondary | ICD-10-CM | POA: Diagnosis not present

## 2019-12-08 DIAGNOSIS — R0681 Apnea, not elsewhere classified: Secondary | ICD-10-CM

## 2019-12-08 NOTE — Progress Notes (Signed)
SLEEP MEDICINE CLINIC    Provider:  Melvyn Novas, MD  Primary Care Physician:  Everrett Coombe, DO [facility] [address]  [address] [place] [address]     Referring Provider: same         Chief Complaint according to patient   Patient presents with:     New Patient (Initial Visit)     presents today to rule out OSA. he had a SS 2015 and never was required to start a machine. his wife has witnessed apnea events and the patient states that he is waking up 3-5 times a night. over last 2-3 months has not had restful sleep.       HISTORY OF PRESENT ILLNESS:  Connor Jefferson is a 39 year- old Asian male patient of [place] descent, who grew up in [place], and was born in the [place], seen here upon a request for sleep consultation  on 12/08/2019 from Dr. Jerolyn Center.   Chief concern according to patient :  " I just feel I couldn't sleep in the sleep lab"- in the meanwhile his wife had reported more snoring, more apnea and he has gained weight' .   I have the pleasure of seeing Connor Jefferson today, a right -handed Asian male, who  has a past medical history of Gout, appendectomy, and Obesity.   The patient had the first sleep study in the year 2015 - The patient underwent an attended sleep study with Dr. Jetty Duhamel 03-20-2014 at the time he endorsed the Epworth sleepiness score at 10 his BMI was just 35.  His respiratory data showed an AHI of only 3.5/h.  These were all obstructive events and a REM AHI was 8.7 this was not enough to permit CPAP titration.  He felt that he did not have a representative night of sleep even that the total sleep time was 366.5 minutes so 6 hours recorded.  He had 2 REM sleep phases, he woke up at 5 AM he was asleep at 11 PM.  No periodic limb movements that led to any arousals, no prolonged hypoxemia.   Sleep relevant medical history: snoring, gouty arthritis, obesity, .  Family medical /sleep history: No other family member on CPAP with OSA, insomnia, and  no sleep walkers.    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   and lives in a household with his spouse, 2 children 6 and 5 .  The patient currently works in daytime. Pets are present. One dog.  Tobacco use: never   ETOH use: not because of gout. ,  Caffeine intake in form of Coffee( 4-5) Soda( /) Tea ( some) or energy drinks. Regular exercise in form of gym exercises.   Hobbies : none       Sleep habits are as follows: The patient's dinner time is between 6-7.30 PM.  The patient goes to bed at 9 PM and falls asleep- 11 PM, tossing and turning- he continues to sleep for 1-2 hour intervals , wakes for bathroom break. The preferred sleep position is variable , with the support of 8 pillows. He tosses and turns. He wakes up and feels distressed- but fully alert- and if great difficulties to go back to sleep   Dreams are reportedly rare.  3.30 and 5 and finally  8 AM is the usual rise time. On a work day he gets up at 5 AM/ The patient wakes up spontaneously.  He  reports not feeling refreshed or restored in AM, with symptoms such as residual fatigue.  Naps are taken infrequently, but craves a nap - " I am looking for a place to crash".     Review of Systems: Out of a complete 14 system review, the patient complains of only the following symptoms, and all other reviewed systems are negative.:  Fatigue, sleepiness , snoring, Heavily  fragmented sleep, Insomnia - many early awakenings. Not NOCTURIA.     How likely are you to doze in the following situations: 0 = not likely, 1 = slight chance, 2 = moderate chance, 3 = high chance   Sitting and Reading? Watching Television? Sitting inactive in a public place (theater or meeting)? As a passenger in a car for an hour without a break? Lying down in the afternoon when circumstances permit? Sitting and talking to someone? Sitting quietly after lunch without alcohol? In a car,  while stopped for a few minutes in traffic?   Total = 14/ 24 points   FSS endorsed at 54/ 63 points.   Social History   Socioeconomic History   Marital status: Married    Spouse name: Not on file   Number of children: Not on file   Years of education: Not on file   Highest education level: Not on file  Occupational History   Not on file  Tobacco Use   Smoking status: Never Smoker   Smokeless tobacco: Never Used  Vaping Use   Vaping Use: Never used  Substance and Sexual Activity   Alcohol use: Yes    Comment: socially/ once a month    Drug use: No   Sexual activity: Yes  Other Topics Concern   Not on file  Social History Narrative   Not on file   Social Determinants of Health   Financial Resource Strain:    Difficulty of Paying Living Expenses: Not on file  Food Insecurity:    Worried About Running Out of Food in the Last Year: Not on file   Ran Out of Food in the Last Year: Not on file  Transportation Needs:    Lack of Transportation (Medical): Not on file   Lack of Transportation (Non-Medical): Not on file  Physical Activity:    Days of Exercise per Week: Not on file   Minutes of Exercise per Session: Not on file  Stress:    Feeling of Stress : Not on file  Social Connections:    Frequency of Communication with Friends and Family: Not on file   Frequency of Social Gatherings with Friends and Family: Not on file   Attends Religious Services: Not on file   Active Member of Clubs or Organizations: Not on file   Attends BankerClub or Organization Meetings: Not on file   Marital Status: Not on file    Family History  Problem Relation Age of Onset   Diabetes Paternal Grandfather    Leukemia Mother        deceased   Non-Hodgkin's lymphoma Mother    Healthy Father     Past Medical History:  Diagnosis Date   Gout    Hx of appendectomy    Obesity     Past Surgical History:  Procedure Laterality Date   APPENDECTOMY        Current Outpatient Medications on File Prior to Visit  Medication Sig Dispense Refill   Cholecalciferol (VITAMIN D3 PO) Take 1 tablet by mouth daily.     Cyanocobalamin (VITAMIN B-12  PO) Take 1 tablet by mouth daily.     dicyclomine (BENTYL) 10 MG capsule Take 1 capsule (10 mg total) by mouth 4 (four) times daily -  before meals and at bedtime. Take medication half an hour before meals and bedtime. 120 capsule 11   Febuxostat (ULORIC) 80 MG TABS Take by mouth daily.      Multiple Vitamin (MULTIVITAMIN) tablet Take 1 tablet by mouth daily.     Probiotic Product (PROBIOTIC PO) Take 1 tablet by mouth daily.     vitamin C (ASCORBIC ACID) 500 MG tablet Take 500 mg by mouth daily.     No current facility-administered medications on file prior to visit.    Allergies  Allergen Reactions   Allopurinol Rash    Physical exam:  Today's Vitals   12/08/19 1005  BP: (!) 155/107  Pulse: 87  Weight: 247 lb (112 kg)  Height: 5\' 8"  (1.727 m)   Body mass index is 37.56 kg/m.   Wt Readings from Last 3 Encounters:  12/08/19 247 lb (112 kg)  10/08/19 243 lb 8 oz (110.5 kg)  07/23/19 232 lb (105.2 kg)     Ht Readings from Last 3 Encounters:  12/08/19 5\' 8"  (1.727 m)  10/08/19 5' 8.11" (1.73 m)  07/23/19 5\' 8"  (1.727 m)      General: The patient is awake, alert and appears not in acute distress. The patient is well groomed. Head: Normocephalic, atraumatic. Neck is supple. Mallampati: 2,  neck circumference: 17 inches . Nasal airflow patent.  Retrognathia is not  seen.  Dental status: intact  Cardiovascular:  Regular rate and cardiac rhythm by pulse,  without distended neck veins. Respiratory: Lungs are clear to auscultation.  Skin:  Without evidence of ankle edema, or rash. Trunk: The patient's posture is erect.   Neurologic exam : The patient is awake and alert, oriented to place and time.   Memory subjective described as intact.  Attention span & concentration ability  appears normal.  Speech is fluent,  without  dysarthria, dysphonia or aphasia.  Mood and affect are appropriate.   Cranial nerves: no loss of smell or taste reported  Pupils are equal and briskly reactive to light. Funduscopic exam deferred.   Extraocular movements in vertical and horizontal planes were intact and without nystagmus. No Diplopia. Visual fields by finger perimetry are intact. Hearing was intact to soft voice and finger rubbing.    Facial sensation intact to fine touch.  Facial motor strength is symmetric and tongue and uvula move midline.  Neck ROM : rotation, tilt and flexion extension were normal for age and shoulder shrug was symmetrical.    Motor exam:  Symmetric bulk, tone and ROM.   Normal tone without cog wheeling, symmetric grip strength .   Sensory:  Fine touch and vibration were normal.  Proprioception tested in the upper extremities was normal.   Coordination: Rapid alternating movements in the fingers/hands were of normal speed.  The Finger-to-nose maneuver was intact without evidence of ataxia, dysmetria or tremor.   Gait and station: Patient could rise unassisted from a seated position, walked without assistive device.  Stance is of normal width/ base and the patient turned with 3 steps.  Toe and heel walk were deferred.  Deep tendon reflexes: in the  upper and lower extremities are symmetric and intact.  Babinski response was deferred.       After spending a total time of 45  minutes face to face and additional time for physical  and neurologic examination, review of laboratory studies,  personal review of imaging studies, reports and results of other testing and review of referral information / records as far as provided in visit, I have established the following assessments:  1) Mr. Connor Jefferson explains that he has had a increasing frustration with early morning arousals, frequent nocturnal arousals that are without any trigger he can identify.  He has gained  weight, his blood pressure has been elevated he has not developed nocturia he rarely dreams and his wife has told him that he snores and is too restless at night to share the bed.  I think it would be worthwhile looking at an attended sleep study given that there is some discomfort with the attached wires etc. what I will do otherwise should his insurance not allow me to screen him for central apneas this way is to do a home sleep test which at least can screen for obstructive sleep apnea.  He has not had restful sleep in quite a while and we may have to go the medication around if we do not find an organic sleep disorder explaining his symptoms.  He reported that he has a stressful job but he is not struggling with anxiety or depression on a day-to-day basis.    My Plan is to proceed with:  1) Attended sleep study - evaluation for other than apnea related sleep fragmentation  2) no irrestible urge to move.  3) Hypersomnia is evident -    I would like to thank Everrett Coombe, DO [address] [place],  [place] [address] for allowing me to meet with and to take care of this pleasant patient.   In short, Connor Jefferson is presenting with insomnia, weight gain and snoring.    Electronically signed by: Melvyn Novas, MD 12/08/2019 10:10 AM  [facility] Board certified by The [employer] of Sleep Medicine and Diplomate of the [employer] of Sleep Medicine. Board certified In Neurology through the ABPN, Fellow of the [employer] of Neu[employer]. Medical Director of [facility].

## 2019-12-08 NOTE — Patient Instructions (Signed)

## 2019-12-18 ENCOUNTER — Telehealth: Payer: Self-pay

## 2019-12-18 NOTE — Telephone Encounter (Signed)
Called to schedule sleep study that was ordered by Dr. Vickey Huger. Pt is relocating to [facility]. Pt states that he thinks it's best to establish a sleep MD in [place] (where he is moving to).
